# Patient Record
Sex: Female | Born: 1945
Health system: Southern US, Community
[De-identification: ages and names within clinical notes are randomized; demographics above are authoritative.]

## PROBLEM LIST (undated history)

## (undated) DIAGNOSIS — K219 Gastro-esophageal reflux disease without esophagitis: Secondary | ICD-10-CM

## (undated) DIAGNOSIS — M810 Age-related osteoporosis without current pathological fracture: Secondary | ICD-10-CM

## (undated) DIAGNOSIS — T7840XA Allergy, unspecified, initial encounter: Secondary | ICD-10-CM

## (undated) DIAGNOSIS — B009 Herpesviral infection, unspecified: Secondary | ICD-10-CM

## (undated) DIAGNOSIS — F419 Anxiety disorder, unspecified: Secondary | ICD-10-CM

## (undated) DIAGNOSIS — B029 Zoster without complications: Secondary | ICD-10-CM

## (undated) DIAGNOSIS — H9319 Tinnitus, unspecified ear: Secondary | ICD-10-CM

## (undated) DIAGNOSIS — K635 Polyp of colon: Secondary | ICD-10-CM

## (undated) DIAGNOSIS — K649 Unspecified hemorrhoids: Secondary | ICD-10-CM

## (undated) DIAGNOSIS — D649 Anemia, unspecified: Secondary | ICD-10-CM

## (undated) DIAGNOSIS — H269 Unspecified cataract: Secondary | ICD-10-CM

## (undated) HISTORY — DX: Anemia, unspecified: D64.9

## (undated) HISTORY — DX: Unspecified cataract: H26.9

## (undated) HISTORY — DX: Tinnitus, unspecified ear: H93.19

## (undated) HISTORY — DX: Herpesviral infection, unspecified: B00.9

## (undated) HISTORY — DX: Zoster without complications: B02.9

## (undated) HISTORY — PX: COLONOSCOPY: SHX174

## (undated) HISTORY — DX: Anxiety disorder, unspecified: F41.9

## (undated) HISTORY — PX: OTHER SURGICAL HISTORY: SHX169

## (undated) HISTORY — DX: Allergy, unspecified, initial encounter: T78.40XA

## (undated) HISTORY — DX: Age-related osteoporosis without current pathological fracture: M81.0

## (undated) HISTORY — DX: Gastro-esophageal reflux disease without esophagitis: K21.9

## (undated) HISTORY — DX: Unspecified hemorrhoids: K64.9

## (undated) HISTORY — DX: Polyp of colon: K63.5

## (undated) HISTORY — PX: NASAL SEPTUM SURGERY: SHX37

---

## 2017-10-25 LAB — HM COLONOSCOPY

## 2018-08-12 ENCOUNTER — Telehealth: Payer: Self-pay

## 2018-08-12 NOTE — Telephone Encounter (Signed)
Copied from [place] [id number]. Topic: General - Other >> Aug 12, 2018 10:39 AM Yvette Rack wrote: Reason for CRM: Pt called to see if her medical records were received from her previous provider's office. Pt requests call back.

## 2018-08-12 NOTE — Telephone Encounter (Signed)
No

## 2018-08-18 NOTE — Telephone Encounter (Signed)
As of last week I did not have them yet   [place]

## 2018-08-18 NOTE — Telephone Encounter (Addendum)
Patient called again to see if records have been received. Patient would like return call regarding status.

## 2018-09-12 ENCOUNTER — Other Ambulatory Visit: Payer: Self-pay

## 2018-09-17 ENCOUNTER — Ambulatory Visit (INDEPENDENT_AMBULATORY_CARE_PROVIDER_SITE_OTHER): Payer: Medicare Other | Admitting: Internal Medicine

## 2018-09-17 ENCOUNTER — Encounter (INDEPENDENT_AMBULATORY_CARE_PROVIDER_SITE_OTHER): Payer: Self-pay

## 2018-09-17 ENCOUNTER — Encounter: Payer: Self-pay | Admitting: Internal Medicine

## 2018-09-17 ENCOUNTER — Other Ambulatory Visit: Payer: Self-pay

## 2018-09-17 VITALS — BP 130/72 | HR 53 | Temp 98.0°F | Resp 18 | Ht 59.5 in | Wt 110.4 lb

## 2018-09-17 DIAGNOSIS — R5383 Other fatigue: Secondary | ICD-10-CM

## 2018-09-17 DIAGNOSIS — Z1231 Encounter for screening mammogram for malignant neoplasm of breast: Secondary | ICD-10-CM | POA: Diagnosis not present

## 2018-09-17 DIAGNOSIS — M810 Age-related osteoporosis without current pathological fracture: Secondary | ICD-10-CM

## 2018-09-17 DIAGNOSIS — H9313 Tinnitus, bilateral: Secondary | ICD-10-CM | POA: Insufficient documentation

## 2018-09-17 DIAGNOSIS — Z13818 Encounter for screening for other digestive system disorders: Secondary | ICD-10-CM

## 2018-09-17 DIAGNOSIS — G4700 Insomnia, unspecified: Secondary | ICD-10-CM | POA: Diagnosis not present

## 2018-09-17 DIAGNOSIS — Z1329 Encounter for screening for other suspected endocrine disorder: Secondary | ICD-10-CM

## 2018-09-17 DIAGNOSIS — Z1159 Encounter for screening for other viral diseases: Secondary | ICD-10-CM

## 2018-09-17 DIAGNOSIS — Z0000 Encounter for general adult medical examination without abnormal findings: Secondary | ICD-10-CM

## 2018-09-17 DIAGNOSIS — Z1389 Encounter for screening for other disorder: Secondary | ICD-10-CM

## 2018-09-17 DIAGNOSIS — F419 Anxiety disorder, unspecified: Secondary | ICD-10-CM | POA: Insufficient documentation

## 2018-09-17 DIAGNOSIS — H9319 Tinnitus, unspecified ear: Secondary | ICD-10-CM

## 2018-09-17 DIAGNOSIS — Z13220 Encounter for screening for lipoid disorders: Secondary | ICD-10-CM

## 2018-09-17 DIAGNOSIS — E559 Vitamin D deficiency, unspecified: Secondary | ICD-10-CM

## 2018-09-17 HISTORY — DX: Insomnia, unspecified: G47.00

## 2018-09-17 MED ORDER — ALPRAZOLAM 0.25 MG PO TABS: 0.1250 mg | ORAL_TABLET | Freq: Every day | ORAL | 5 refills | Status: DC | PRN

## 2018-09-17 NOTE — Progress Notes (Signed)
Chief Complaint  Patient presents with  . New Patient (Initial Visit)   Establish care 1.anxiety/insomnia/tinninits takes xanax 0.25 1/2 qd #15 and does not overuse  2. tinnitis chronic x years she did have hearing tests in the past unsure if results accurate but no f/u with ENT. Xanax prn helps   No issues otherwise    Review of Systems  Constitutional: Negative for weight loss.       Wt gain 3 lbs   HENT: Positive for tinnitus. Negative for hearing loss.   Eyes: Negative for blurred vision.  Respiratory: Negative for shortness of breath.   Cardiovascular: Negative for chest pain.  Gastrointestinal: Negative for abdominal pain.  Musculoskeletal: Negative for joint pain.  Skin: Negative for rash.  Neurological: Negative for headaches.  Psychiatric/Behavioral: The patient is nervous/anxious and has insomnia.    Past Medical History:  Diagnosis Date  . Anxiety    did not like the way ssri made her feel ? name  . Colon polyps   . Shingles    right back/flank remotely lasting 9 months of pain was on gabapentin  . Tinnitus    Past Surgical History:  Procedure Laterality Date  . dental implants     Family History  Problem Relation Age of Onset  . Cancer Mother        breast later in 4280s and uterine  . Cancer Father        colon  . Cancer Brother        colon   Social History   Socioeconomic History  . Marital status: Married    Spouse name: Not on file  . Number of children: Not on file  . Years of education: Not on file  . Highest education level: Not on file  Occupational History  . Not on file  Social Needs  . Financial resource strain: Not on file  . Food insecurity    Worry: Not on file    Inability: Not on file  . Transportation needs    Medical: Not on file    Non-medical: Not on file  Tobacco Use  . Smoking status: Never Smoker  . Smokeless tobacco: Never Used  Substance and Sexual Activity  . Alcohol use: Yes  . Drug use: Never  . Sexual  activity: Yes  Lifestyle  . Physical activity    Days per week: Not on file    Minutes per session: Not on file  . Stress: Not on file  Relationships  . Social Musicianconnections    Talks on phone: Not on file    Gets together: Not on file    Attends religious service: Not on file    Active member of club or organization: Not on file    Attends meetings of clubs or organizations: Not on file    Relationship status: Not on file  . Intimate partner violence    Fear of current or ex partner: Not on file    Emotionally abused: Not on file    Physically abused: Not on file    Forced sexual activity: Not on file  Other Topics Concern  . Not on file  Social History Narrative   [place] to be near son and grandkids    Never smoker    Exercise yes   Current Meds  Medication Sig  . ALPRAZolam (XANAX) 0.25 MG tablet Take 0.5 tablets (0.125 mg total) by mouth daily as needed for anxiety.  . [DISCONTINUED]  ALPRAZolam (XANAX) 0.25 MG tablet Take 0.25 mg by mouth at bedtime as needed for anxiety.   No Known Allergies No results found for this or any previous visit (from the past 2160 hour(s)). Objective  Body mass index is 21.92 kg/m. Wt Readings from Last 3 Encounters:  09/17/18 110 lb 6.4 oz (50.1 kg)   Temp Readings from Last 3 Encounters:  09/17/18 98 F (36.7 C) (Temporal)   BP Readings from Last 3 Encounters:  09/17/18 130/72   Pulse Readings from Last 3 Encounters:  09/17/18 (!) 53    Physical Exam Vitals signs and nursing note reviewed.  Constitutional:      Appearance: Normal appearance. She is well-developed, well-groomed and normal weight.  HENT:     Head: Normocephalic and atraumatic.     Right Ear: Tympanic membrane and ear canal normal.     Left Ear: Tympanic membrane and ear canal normal.     Nose:     Comments: Mask on   Eyes:     Conjunctiva/sclera: Conjunctivae normal.     Pupils: Pupils are equal, round, and reactive to light.   Cardiovascular:     Rate and Rhythm: Normal rate and regular rhythm.     Heart sounds: Normal heart sounds.  Pulmonary:     Effort: Pulmonary effort is normal.     Breath sounds: Normal breath sounds.  Skin:    General: Skin is warm and dry.  Neurological:     General: No focal deficit present.     Mental Status: She is alert and oriented to person, place, and time. Mental status is at baseline.     Gait: Gait normal.  Psychiatric:        Attention and Perception: Attention and perception normal.        Mood and Affect: Mood and affect normal.        Speech: Speech normal.        Behavior: Behavior normal. Behavior is cooperative.        Thought Content: Thought content normal.        Cognition and Memory: Cognition and memory normal.        Judgment: Judgment normal.     Assessment   1. Anxiety/insomnia 2.tinnitis  3. HM Plan   1. Prn xanax 1/2 pill qd prn  2. Disc rec get ENT consult in future if continues to bother pt declines for now  3.  Will get flu shot upcoming  Never had prevnar or pna 23 or shingrix disc'ed today  rec Tdap been >10 years given info today   Pap out of age window  Mammogram referred today  DEXA referred today  Colonoscopy had in 2019 FH brother and dad colon cancer due in 5 years 2024 h/o polyps personally  Skin: currently no issues h/o cysts removal no skin cancer   Supplements zinc, calcium, vitamin D Time spent 30 minutes   Provider: Dr. Olivia Mackie [name]-Internal Medicine

## 2018-09-17 NOTE — Patient Instructions (Addendum)
Call to schedule mammogram an dbone density   Dentist Dr. Tobe Sos [address] [phone]  Tinnitus Tinnitus refers to hearing a sound when there is no actual source for that sound. This is often described as ringing in the ears. However, people with this condition may hear a variety of noises, in one ear or in both ears. The sounds of tinnitus can be soft, loud, or somewhere in between. Tinnitus can last for a few seconds or can be constant for days. It may go away without treatment and come back at various times. When tinnitus is constant or happens often, it can lead to other problems, such as trouble sleeping and trouble concentrating. Almost everyone experiences tinnitus at some point. Tinnitus that is long-lasting (chronic) or comes back often (recurs) may require medical attention. What are the causes? The cause of tinnitus is often not known. In some cases, it can result from other problems or conditions, including:  Exposure to loud noises from machinery, music, or other sources.  Hearing loss.  Ear or sinus infections.  Earwax buildup.  An object (foreign body) stuck in the ear.  Taking certain medicines.  Drinking alcohol or caffeine.  High blood pressure.  Heart diseases.  Anemia.  Allergies.  Meniere's disease.  Thyroid problems.  Tumors.  A weak, bulging blood vessel (aneurysm) near the ear.  Depression or other mood disorders. What are the signs or symptoms? The main symptom of tinnitus is hearing a sound when there is no source for that sound. It may sound like:  Buzzing.  Roaring.  Ringing.  Blowing air, like the sound heard when you listen to a seashell.  Hissing.  Whistling.  Sizzling.  Humming.  Running water.  A musical note.  Tapping. Symptoms may affect only one ear (unilateral) or both ears (bilateral). How is this diagnosed? Tinnitus is diagnosed based on your symptoms, your medical history, and a physical  exam. Your health care provider may do a thorough hearing test (audiologic exam) if your tinnitus:  Is unilateral.  Causes hearing difficulties.  Lasts 6 months or longer. You may work with a health care provider who specializes in hearing disorders (audiologist). You may be asked questions about your symptoms and how they affect your daily life. You may have other tests done, such as:  CT scan.  MRI.  An imaging test of how blood flows through your blood vessels (angiogram). How is this treated? Treating an underlying medical condition can sometimes make tinnitus go away. If your tinnitus continues, other treatments may include:  Medicines, such as antidepressants or sleeping aids.  Sound generators to mask the tinnitus. These include: ? Tabletop sound machines that play relaxing sounds to help you fall asleep. ? Wearable devices that fit in your ear and play sounds or music. ? Acoustic neural stimulation. This involves using headphones to listen to music that contains an auditory signal. Over time, listening to this signal may change some pathways in your brain and make you less sensitive to tinnitus. This treatment is used for very severe cases when no other treatment is working.  Therapy and counseling to help you manage the stress of living with tinnitus.  Using hearing aids or cochlear implants if your tinnitus is related to hearing loss. Hearing aids are worn in the outer ear. Cochlear implants are surgically placed in the inner ear. Follow these instructions at home: Managing symptoms      When possible, avoid being in loud places and being  exposed to loud sounds.  Wear hearing protection, such as earplugs, when you are exposed to loud noises.  Use a white noise machine, a humidifier, or other devices to mask the sound of tinnitus.  Practice techniques for reducing stress, such as meditation, yoga, or deep breathing. Work with your health care provider if you need help  with managing stress.  Sleep with your head slightly raised. This may reduce the impact of tinnitus. General instructions  Do not use stimulants, such as nicotine, alcohol, or caffeine. Talk with your health care provider about other stimulants to avoid. Stimulants are substances that can make you feel alert and attentive by increasing certain activities in the body (such as heart rate and blood pressure). These substances may make tinnitus worse.  Take over-the-counter and prescription medicines only as told by your health care provider.  Try to get plenty of sleep each night.  Keep all follow-up visits as told by your health care provider. This is important. Contact a health care provider if:  Your tinnitus continues for 3 weeks or longer without stopping.  Your symptoms get worse or do not get better with home care.  You develop tinnitus after a head injury.  You have tinnitus along with any of the following: ? Dizziness. ? Loss of balance. ? Nausea and vomiting. Summary  Tinnitus refers to hearing a sound when there is no actual source for that sound. This is often described as ringing in the ears.  Symptoms may affect only one ear (unilateral) or both ears (bilateral).  Use a white noise machine, a humidifier, or other devices to mask the sound of tinnitus.  Do not use stimulants, such as nicotine, alcohol, or caffeine. Talk with your health care provider about other stimulants to avoid. These substances may make tinnitus worse. This information is not intended to replace advice given to you by your health care provider. Make sure you discuss any questions you have with your health care provider. Document Released: 01/22/2005 Document Revised: 01/04/2017 Document Reviewed: 11/01/2016 Elsevier Patient Education  2020 Elsevier Inc.  Zoster Vaccine, Recombinant injection What is this medicine? ZOSTER VACCINE (ZOS ter vak SEEN) is used to prevent shingles in adults 74 years  old and over. This vaccine is not used to treat shingles or nerve pain from shingles. This medicine may be used for other purposes; ask your health care provider or pharmacist if you have questions. COMMON BRAND NAME(S): [facility] What should I tell my health care provider before I take this medicine? They need to know if you have any of these conditions:  blood disorders or disease  cancer like leukemia or lymphoma  immune system problems or therapy  an unusual or allergic reaction to vaccines, other medications, foods, dyes, or preservatives  pregnant or trying to get pregnant  breast-feeding How should I use this medicine? This vaccine is for injection in a muscle. It is given by a health care professional. Talk to your pediatrician regarding the use of this medicine in children. This medicine is not approved for use in children. Overdosage: If you think you have taken too much of this medicine contact a poison control center or emergency room at once. NOTE: This medicine is only for you. Do not share this medicine with others. What if I miss a dose? Keep appointments for follow-up (booster) doses as directed. It is important not to miss your dose. Call your doctor or health care professional if you are unable to keep an appointment. What  may interact with this medicine?  medicines that suppress your immune system  medicines to treat cancer  steroid medicines like prednisone or cortisone This list may not describe all possible interactions. Give your health care provider a list of all the medicines, herbs, non-prescription drugs, or dietary supplements you use. Also tell them if you smoke, drink alcohol, or use illegal drugs. Some items may interact with your medicine. What should I watch for while using this medicine? Visit your doctor for regular check ups. This vaccine, like all vaccines, may not fully protect everyone. What side effects may I notice from receiving this  medicine? Side effects that you should report to your doctor or health care professional as soon as possible:  allergic reactions like skin rash, itching or hives, swelling of the face, lips, or tongue  breathing problems Side effects that usually do not require medical attention (report these to your doctor or health care professional if they continue or are bothersome):  chills  headache  fever  nausea, vomiting  redness, warmth, pain, swelling or itching at site where injected  tiredness This list may not describe all possible side effects. Call your doctor for medical advice about side effects. You may report side effects to FDA at 1-800-FDA-1088. Where should I keep my medicine? This vaccine is only given in a clinic, pharmacy, doctor's office, or other health care setting and will not be stored at home. NOTE: This sheet is a summary. It may not cover all possible information. If you have questions about this medicine, talk to your doctor, pharmacist, or health care provider.  2020 Elsevier/Gold Standard (2016-09-03 13:20:30)  Pneumococcal Conjugate Vaccine (PCV13): What You Need to Know 1. Why get vaccinated? Pneumococcal conjugate vaccine (PCV13) can prevent pneumococcal disease. Pneumococcal disease refers to any illness caused by pneumococcal bacteria. These bacteria can cause many types of illnesses, including pneumonia, which is an infection of the lungs. Pneumococcal bacteria are one of the most common causes of pneumonia. Besides pneumonia, pneumococcal bacteria can also cause:  Ear infections  Sinus infections  Meningitis (infection of the tissue covering the brain and spinal cord)  Bacteremia (bloodstream infection) Anyone can get pneumococcal disease, but children under 22 years of age, people with certain medical conditions, adults 81 years or older, and cigarette smokers are at the highest risk. Most pneumococcal infections are mild. However, some can result  in long-term problems, such as brain damage or hearing loss. Meningitis, bacteremia, and pneumonia caused by pneumococcal disease can be fatal. 2. PCV13 PCV13 protects against 13 types of bacteria that cause pneumococcal disease. Infants and young children usually need 4 doses of pneumococcal conjugate vaccine, at 2, 4, 6, and 58-81 months of age. In some cases, a child might need fewer than 4 doses to complete PCV13 vaccination. A dose of PCV23 vaccine is also recommended for anyone 2 years or older with certain medical conditions if they did not already receive PCV13. This vaccine may be given to adults 27 years or older based on discussions between the patient and health care provider. 3. Talk with your health care provider Tell your vaccine provider if the person getting the vaccine:  Has had an allergic reaction after a previous dose of PCV13, to an earlier pneumococcal conjugate vaccine known as PCV7, or to any vaccine containing diphtheria toxoid (for example, DTaP), or has any severe, life-threatening allergies.  In some cases, your health care provider may decide to postpone PCV13 vaccination to a future visit. People with minor  illnesses, such as a cold, may be vaccinated. People who are moderately or severely ill should usually wait until they recover before getting PCV13. Your health care provider can give you more information. 4. Risks of a vaccine reaction  Redness, swelling, pain, or tenderness where the shot is given, and fever, loss of appetite, fussiness (irritability), feeling tired, headache, and chills can happen after PCV13. Young children may be at increased risk for seizures caused by fever after PCV13 if it is administered at the same time as inactivated influenza vaccine. Ask your health care provider for more information. People sometimes faint after medical procedures, including vaccination. Tell your provider if you feel dizzy or have vision changes or ringing in the  ears. As with any medicine, there is a very remote chance of a vaccine causing a severe allergic reaction, other serious injury, or death. 5. What if there is a serious problem? An allergic reaction could occur after the vaccinated person leaves the clinic. If you see signs of a severe allergic reaction (hives, swelling of the face and throat, difficulty breathing, a fast heartbeat, dizziness, or weakness), call 9-1-1 and get the person to the nearest hospital. For other signs that concern you, call your health care provider. Adverse reactions should be reported to the Vaccine Adverse Event Reporting System (VAERS). Your health care provider will usually file this report, or you can do it yourself. Visit the VAERS website at www.vaers.LAgents.nohhs.gov or call 24902818571-615-587-1878. VAERS is only for reporting reactions, and VAERS staff do not give medical advice. 6. The National Vaccine Injury Compensation Program The [employer] Vaccine Injury Compensation Program (VICP) is a federal program that was created to compensate people who may have been injured by certain vaccines. Visit the VICP website at SpiritualWord.atwww.hrsa.gov/vaccinecompensation or call 51413984361-(613)145-7168 to learn about the program and about filing a claim. There is a time limit to file a claim for compensation. 7. How can I learn more?  Ask your health care provider.  Call your local or state health department.  Contact the Centers for Disease Control and Prevention (CDC): ? Call 937-122-50341-(641)565-6301 (1-800-CDC-INFO) or ? Visit CDC's website at PicCapture.uywww.cdc.gov/vaccines Vaccine Information Statement PCV13 Vaccine (12/04/2017) This information is not intended to replace advice given to you by your health care provider. Make sure you discuss any questions you have with your health care provider. Document Released: 11/19/2005 Document Revised: 05/13/2018 Document Reviewed: 09/03/2017 Elsevier Patient Education  2020 Elsevier Inc.  Pneumococcal Conjugate Vaccine suspension  for injection What is this medicine? PNEUMOCOCCAL VACCINE (NEU mo KOK al vak SEEN) is a vaccine used to prevent pneumococcus bacterial infections. These bacteria can cause serious infections like pneumonia, meningitis, and blood infections. This vaccine will lower your chance of getting pneumonia. If you do get pneumonia, it can make your symptoms milder and your illness shorter. This vaccine will not treat an infection and will not cause infection. This vaccine is recommended for infants and young children, adults with certain medical conditions, and adults 65 years or older. This medicine may be used for other purposes; ask your health care provider or pharmacist if you have questions. COMMON BRAND NAME(S): Prevnar, Prevnar 13 What should I tell my health care provider before I take this medicine? They need to know if you have any of these conditions:  bleeding problems  fever  immune system problems  an unusual or allergic reaction to pneumococcal vaccine, diphtheria toxoid, other vaccines, latex, other medicines, foods, dyes, or preservatives  pregnant or trying to get pregnant  breast-feeding How should I use this medicine? This vaccine is for injection into a muscle. It is given by a health care professional. A copy of Vaccine Information Statements will be given before each vaccination. Read this sheet carefully each time. The sheet may change frequently. Talk to your pediatrician regarding the use of this medicine in children. While this drug may be prescribed for children as young as 636 weeks old for selected conditions, precautions do apply. Overdosage: If you think you have taken too much of this medicine contact a poison control center or emergency room at once. NOTE: This medicine is only for you. Do not share this medicine with others. What if I miss a dose? It is important not to miss your dose. Call your doctor or health care professional if you are unable to keep an  appointment. What may interact with this medicine?  medicines for cancer chemotherapy  medicines that suppress your immune function  steroid medicines like prednisone or cortisone This list may not describe all possible interactions. Give your health care provider a list of all the medicines, herbs, non-prescription drugs, or dietary supplements you use. Also tell them if you smoke, drink alcohol, or use illegal drugs. Some items may interact with your medicine. What should I watch for while using this medicine? Mild fever and pain should go away in 3 days or less. Report any unusual symptoms to your doctor or health care professional. What side effects may I notice from receiving this medicine? Side effects that you should report to your doctor or health care professional as soon as possible:  allergic reactions like skin rash, itching or hives, swelling of the face, lips, or tongue  breathing problems  confused  fast or irregular heartbeat  fever over 102 degrees F  seizures  unusual bleeding or bruising  unusual muscle weakness Side effects that usually do not require medical attention (report to your doctor or health care professional if they continue or are bothersome):  aches and pains  diarrhea  fever of 102 degrees F or less  headache  irritable  loss of appetite  pain, tender at site where injected  trouble sleeping This list may not describe all possible side effects. Call your doctor for medical advice about side effects. You may report side effects to FDA at 1-800-FDA-1088. Where should I keep my medicine? This does not apply. This vaccine is given in a clinic, pharmacy, doctor's office, or other health care setting and will not be stored at home. NOTE: This sheet is a summary. It may not cover all possible information. If you have questions about this medicine, talk to your doctor, pharmacist, or health care provider.  2020 Elsevier/Gold Standard  (2013-10-29 10:27:27)

## 2018-09-22 ENCOUNTER — Other Ambulatory Visit: Payer: Medicare Other

## 2018-09-23 ENCOUNTER — Other Ambulatory Visit (INDEPENDENT_AMBULATORY_CARE_PROVIDER_SITE_OTHER): Payer: Medicare Other

## 2018-09-23 ENCOUNTER — Other Ambulatory Visit: Payer: Self-pay

## 2018-09-23 DIAGNOSIS — Z0000 Encounter for general adult medical examination without abnormal findings: Secondary | ICD-10-CM | POA: Diagnosis not present

## 2018-09-23 DIAGNOSIS — Z13220 Encounter for screening for lipoid disorders: Secondary | ICD-10-CM

## 2018-09-23 DIAGNOSIS — Z1329 Encounter for screening for other suspected endocrine disorder: Secondary | ICD-10-CM | POA: Diagnosis not present

## 2018-09-23 DIAGNOSIS — F419 Anxiety disorder, unspecified: Secondary | ICD-10-CM

## 2018-09-23 DIAGNOSIS — Z1159 Encounter for screening for other viral diseases: Secondary | ICD-10-CM

## 2018-09-23 DIAGNOSIS — Z13818 Encounter for screening for other digestive system disorders: Secondary | ICD-10-CM

## 2018-09-23 DIAGNOSIS — Z1389 Encounter for screening for other disorder: Secondary | ICD-10-CM

## 2018-09-23 DIAGNOSIS — E559 Vitamin D deficiency, unspecified: Secondary | ICD-10-CM

## 2018-09-23 DIAGNOSIS — R5383 Other fatigue: Secondary | ICD-10-CM | POA: Diagnosis not present

## 2018-09-23 LAB — COMPREHENSIVE METABOLIC PANEL
ALT: 19 U/L (ref 0–35)
AST: 16 U/L (ref 0–37)
Albumin: 4.4 g/dL (ref 3.5–5.2)
Alkaline Phosphatase: 78 U/L (ref 39–117)
BUN: 9 mg/dL (ref 6–23)
CO2: 26 mEq/L (ref 19–32)
Calcium: 9.6 mg/dL (ref 8.4–10.5)
Chloride: 106 mEq/L (ref 96–112)
Creatinine, Ser: 0.74 mg/dL (ref 0.40–1.20)
GFR: 76.62 mL/min (ref >60.00)
Glucose, Bld: 94 mg/dL (ref 70–99)
Potassium: 4.2 mEq/L (ref 3.5–5.1)
Sodium: 141 mEq/L (ref 135–145)
Total Bilirubin: 0.4 mg/dL (ref 0.2–1.2)
Total Protein: 6.7 g/dL (ref 6.0–8.3)

## 2018-09-23 LAB — CBC WITH DIFFERENTIAL/PLATELET
Basophils Absolute: 0 10*3/uL (ref 0.0–0.1)
Basophils Relative: 0.5 % (ref 0.0–3.0)
Eosinophils Absolute: 0.1 10*3/uL (ref 0.0–0.7)
Eosinophils Relative: 2.3 % (ref 0.0–5.0)
HCT: 41.1 % (ref 36.0–46.0)
Hemoglobin: 13.7 g/dL (ref 12.0–15.0)
Lymphocytes Relative: 34.8 % (ref 12.0–46.0)
Lymphs Abs: 2.1 10*3/uL (ref 0.7–4.0)
MCHC: 33.4 g/dL (ref 30.0–36.0)
MCV: 95.5 fl (ref 78.0–100.0)
Monocytes Absolute: 0.3 10*3/uL (ref 0.1–1.0)
Monocytes Relative: 5.3 % (ref 3.0–12.0)
Neutro Abs: 3.5 10*3/uL (ref 1.4–7.7)
Neutrophils Relative %: 57.1 % (ref 43.0–77.0)
Platelets: 190 10*3/uL (ref 150.0–400.0)
RBC: 4.31 Mil/uL (ref 3.87–5.11)
RDW: 12.9 % (ref 11.5–15.5)
WBC: 6.1 10*3/uL (ref 4.0–10.5)

## 2018-09-23 LAB — LIPID PANEL
Cholesterol: 203 mg/dL — ABNORMAL HIGH (ref 0–200)
HDL: 69.6 mg/dL (ref >39.00)
LDL Cholesterol: 115 mg/dL — ABNORMAL HIGH (ref 0–99)
NonHDL: 132.91
Total CHOL/HDL Ratio: 3
Triglycerides: 88 mg/dL (ref 0.0–149.0)
VLDL: 17.6 mg/dL (ref 0.0–40.0)

## 2018-09-23 LAB — VITAMIN D 25 HYDROXY (VIT D DEFICIENCY, FRACTURES): VITD: 49.02 ng/mL (ref 30.00–100.00)

## 2018-09-23 LAB — TSH: TSH: 1.92 u[IU]/mL (ref 0.35–4.50)

## 2018-09-24 LAB — URINALYSIS, ROUTINE W REFLEX MICROSCOPIC
Bilirubin Urine: NEGATIVE
Glucose, UA: NEGATIVE
Hgb urine dipstick: NEGATIVE
Ketones, ur: NEGATIVE
Leukocytes,Ua: NEGATIVE
Nitrite: NEGATIVE
Protein, ur: NEGATIVE
Specific Gravity, Urine: 1.002 (ref 1.001–1.03)
pH: 6.5 (ref 5.0–8.0)

## 2018-09-24 LAB — HEPATITIS C ANTIBODY
Hepatitis C Ab: NONREACTIVE
SIGNAL TO CUT-OFF: 0.01 (ref <1.00)

## 2018-09-30 ENCOUNTER — Telehealth: Payer: Self-pay | Admitting: Internal Medicine

## 2018-09-30 ENCOUNTER — Encounter: Payer: Self-pay | Admitting: Internal Medicine

## 2018-09-30 NOTE — Telephone Encounter (Signed)
FYI-Pt wanted to let Dr Aundra Dubin know she's had colonoscopy 2019 in the fall. Not due for another 5 years.Thank you!

## 2018-10-01 ENCOUNTER — Encounter: Payer: Self-pay | Admitting: Internal Medicine

## 2018-10-01 NOTE — Telephone Encounter (Signed)
See mychart.  

## 2018-10-01 NOTE — Telephone Encounter (Signed)
Patient had colonoscopy in [place]. Was supposed to get me the name of the doctor but phone was disconnected.

## 2018-10-02 ENCOUNTER — Ambulatory Visit: Payer: Medicare Other | Admitting: Internal Medicine

## 2018-10-03 ENCOUNTER — Ambulatory Visit (INDEPENDENT_AMBULATORY_CARE_PROVIDER_SITE_OTHER): Payer: Medicare Other

## 2018-10-03 ENCOUNTER — Other Ambulatory Visit: Payer: Self-pay

## 2018-10-03 DIAGNOSIS — Z23 Encounter for immunization: Secondary | ICD-10-CM | POA: Diagnosis not present

## 2018-10-16 ENCOUNTER — Encounter: Payer: Self-pay | Admitting: Internal Medicine

## 2018-11-26 ENCOUNTER — Ambulatory Visit
Admission: RE | Admit: 2018-11-26 | Discharge: 2018-11-26 | Disposition: A | Discharge status: Home / Self Care | Payer: Medicare Other | Source: Ambulatory Visit | Attending: Internal Medicine | Admitting: Internal Medicine

## 2018-11-26 ENCOUNTER — Encounter: Payer: Self-pay | Admitting: Radiology

## 2018-11-26 DIAGNOSIS — M810 Age-related osteoporosis without current pathological fracture: Secondary | ICD-10-CM

## 2018-11-26 DIAGNOSIS — Z1231 Encounter for screening mammogram for malignant neoplasm of breast: Secondary | ICD-10-CM | POA: Diagnosis present

## 2018-11-27 ENCOUNTER — Encounter: Payer: Self-pay | Admitting: Internal Medicine

## 2018-12-10 ENCOUNTER — Other Ambulatory Visit: Payer: Self-pay | Admitting: Internal Medicine

## 2018-12-10 ENCOUNTER — Encounter: Payer: Self-pay | Admitting: Internal Medicine

## 2018-12-10 DIAGNOSIS — R928 Other abnormal and inconclusive findings on diagnostic imaging of breast: Secondary | ICD-10-CM

## 2018-12-10 DIAGNOSIS — N6489 Other specified disorders of breast: Secondary | ICD-10-CM

## 2019-02-04 ENCOUNTER — Encounter: Payer: Self-pay | Admitting: Internal Medicine

## 2019-02-11 ENCOUNTER — Encounter: Payer: Self-pay | Admitting: Internal Medicine

## 2019-03-25 ENCOUNTER — Ambulatory Visit: Payer: Medicare Other | Admitting: Internal Medicine

## 2019-04-06 ENCOUNTER — Encounter: Payer: Self-pay | Admitting: Family Medicine

## 2019-04-06 ENCOUNTER — Ambulatory Visit (INDEPENDENT_AMBULATORY_CARE_PROVIDER_SITE_OTHER): Payer: Medicare Other | Admitting: Family Medicine

## 2019-04-06 ENCOUNTER — Telehealth: Payer: Self-pay | Admitting: Internal Medicine

## 2019-04-06 VITALS — BP 147/76 | HR 66 | Temp 98.2°F | Wt 109.0 lb

## 2019-04-06 DIAGNOSIS — R197 Diarrhea, unspecified: Secondary | ICD-10-CM | POA: Diagnosis not present

## 2019-04-06 NOTE — Telephone Encounter (Signed)
For your information  

## 2019-04-06 NOTE — Progress Notes (Signed)
I connected with Beverly Lowe on 04/06/19 at  4:20 PM EST by video and verified that I am speaking with the correct person using two identifiers.   I discussed the limitations, risks, security and privacy concerns of performing an evaluation and management service by video and the availability of in person appointments. I also discussed with the patient that there may be a patient responsible charge related to this service. The patient expressed understanding and agreed to proceed.  Patient location: Home Provider Location: [facility] Participants: Lynnda Child and Beverly Lowe   Subjective:     Beverly Lowe is a 74 y.o. female presenting for Diarrhea (and stomach cramps. Started on 03/30/19 after she came from [place]. started there also after eating a certain meal)     Diarrhea  This is a new problem. The current episode started in the past 7 days. The problem occurs less than 2 times per day. The stool consistency is described as watery. The patient states that diarrhea does not awaken her from sleep. Associated symptoms include abdominal pain (cramps across the stomach). Pertinent negatives include no bloating, chills, coughing, fever, headaches, increased  flatus, myalgias, sweats, vomiting or weight loss. Associated symptoms comments: Nausea, low appetite. Exacerbated by: any food product. Treatments tried: modifying diet.   Did get bad diarrhea after eating a lot of food - did have ahi tuna, no one else got sick after this meal. Does not typically eat and drink as much as she did.     Review of Systems  Constitutional: Negative for chills, fever and weight loss.  Respiratory: Negative for cough.   Gastrointestinal: Positive for abdominal pain (cramps across the stomach) and diarrhea. Negative for bloating, flatus and vomiting.  Musculoskeletal: Negative for myalgias.  Neurological: Negative for headaches.     Social History   Tobacco Use  Smoking  Status Never Smoker  Smokeless Tobacco Never Used        Objective:   BP Readings from Last 3 Encounters:  04/06/19 (!) 147/76  09/17/18 130/72   Wt Readings from Last 3 Encounters:  04/06/19 109 lb (49.4 kg)  09/17/18 110 lb 6.4 oz (50.1 kg)   BP (!) 147/76 Comment: per patient  Pulse 66 Comment: per patient  Temp 98.2 F (36.8 C) Comment: per patient  Wt 109 lb (49.4 kg) Comment: per patient  BMI 21.65 kg/m    Physical Exam Constitutional:      Appearance: Normal appearance. She is not ill-appearing.  HENT:     Head: Normocephalic and atraumatic.     Right Ear: External ear normal.     Left Ear: External ear normal.  Eyes:     Conjunctiva/sclera: Conjunctivae normal.  Pulmonary:     Effort: Pulmonary effort is normal. No respiratory distress.  Neurological:     Mental Status: She is alert. Mental status is at baseline.  Psychiatric:        Mood and Affect: Mood normal.        Behavior: Behavior normal.        Thought Content: Thought content normal.        Judgment: Judgment normal.             Assessment & Plan:   Problem List Items Addressed This Visit    None    Visit Diagnoses    Diarrhea of presumed infectious origin    -  Primary   Relevant Orders   Gastrointestinal Pathogen Panel PCR  Etiology unclear. New onset after travel to [place] and eating raw fish. Though persisting with 1 BM daily after eating. Will get infectious panel. Recommend f/u if negative and symptoms persist  Return if symptoms worsen or fail to improve.  Lesleigh Noe, MD

## 2019-04-06 NOTE — Telephone Encounter (Signed)
Pt called back and said she has had watery stools since she got back from [place] last Monday. She thinks she has a parasite or virus. She can't eat without having to go to bathroom. Since we did not have any availability today I got her scheduled with Dr. Selena Batten @ [facility] today @ 4:30. She still wanted her pcp to know what was going on.

## 2019-04-06 NOTE — Telephone Encounter (Signed)
Lm to call office to make an appointment per patient's message through MyChart. If patient needs appointment needs to be virtual.

## 2019-04-07 ENCOUNTER — Other Ambulatory Visit: Payer: PRIVATE HEALTH INSURANCE

## 2019-04-07 ENCOUNTER — Telehealth: Payer: Self-pay

## 2019-04-07 NOTE — Addendum Note (Signed)
Addended by: Alvina Chou on: 04/07/2019 02:30 PM   Modules accepted: Orders

## 2019-04-07 NOTE — Telephone Encounter (Signed)
Noted  

## 2019-04-07 NOTE — Telephone Encounter (Signed)
This is a patient of [employer] [facility] and was seen for a virtual visit by Dr. Einar Pheasant yesterday. Patient recently went on a trip to [place] and has been suffering from diarrhea since returning home.  Dr. Einar Pheasant ordered a PCR stool kit for patient after her virtual visit, and patient stated that she is closer to [facility] location, where she is normally seen at, and she was wondering if she could pick up the stool kit there. Patient called the [facility] location and spoke to the lab and they told her that she could not pick up the stool kit there because of COVID precautions, and that they could not even take it out to her car.  Patient then stated to the lab that she will come to the [facility] office to pick up the kit, but asked if they could just explain to her how to do the stool kit because she does not understand, and they said she would have to ask Dr. Verda Cumins office office because it was "not their responsibility." Patient's states she did not understand this because she has had labs done between [place] offices before and she thought this would be ok for them to explain a standard procedure.  Patient states she did not feel good at all, and she just wanted to pick up a stool kit so she could find out what is wrong with her. Patient has been contacted today and she is coming here at 11:00 to pick up PCR stool kit and I have spoken with Irene Shipper in the lab who will walk her through the process of collecting proper sample.  Will route to [employer], Therapist, sports. Thank you.

## 2019-04-08 LAB — GASTROINTESTINAL PATHOGEN PANEL PCR
C. difficile Tox A/B, PCR: NOT DETECTED
Campylobacter, PCR: NOT DETECTED
Cryptosporidium, PCR: NOT DETECTED
E coli (ETEC) LT/ST PCR: NOT DETECTED
E coli (STEC) stx1/stx2, PCR: NOT DETECTED
E coli 0157, PCR: NOT DETECTED
Giardia lamblia, PCR: NOT DETECTED
Norovirus, PCR: NOT DETECTED
Rotavirus A, PCR: NOT DETECTED
Salmonella, PCR: NOT DETECTED
Shigella, PCR: NOT DETECTED

## 2019-04-09 ENCOUNTER — Ambulatory Visit: Payer: Medicare Other | Admitting: Internal Medicine

## 2019-04-15 ENCOUNTER — Ambulatory Visit: Payer: PRIVATE HEALTH INSURANCE | Admitting: Family Medicine

## 2019-04-30 ENCOUNTER — Other Ambulatory Visit: Payer: Self-pay

## 2019-05-05 ENCOUNTER — Ambulatory Visit (INDEPENDENT_AMBULATORY_CARE_PROVIDER_SITE_OTHER): Payer: Medicare Other | Admitting: Internal Medicine

## 2019-05-05 ENCOUNTER — Encounter: Payer: Self-pay | Admitting: Internal Medicine

## 2019-05-05 ENCOUNTER — Telehealth: Payer: Self-pay | Admitting: Internal Medicine

## 2019-05-05 ENCOUNTER — Other Ambulatory Visit: Payer: Self-pay

## 2019-05-05 VITALS — BP 130/82 | HR 67 | Temp 97.2°F | Ht 59.5 in | Wt 112.8 lb

## 2019-05-05 DIAGNOSIS — B009 Herpesviral infection, unspecified: Secondary | ICD-10-CM | POA: Diagnosis not present

## 2019-05-05 DIAGNOSIS — K649 Unspecified hemorrhoids: Secondary | ICD-10-CM | POA: Diagnosis not present

## 2019-05-05 DIAGNOSIS — Z1389 Encounter for screening for other disorder: Secondary | ICD-10-CM

## 2019-05-05 DIAGNOSIS — Z1329 Encounter for screening for other suspected endocrine disorder: Secondary | ICD-10-CM

## 2019-05-05 DIAGNOSIS — H9313 Tinnitus, bilateral: Secondary | ICD-10-CM

## 2019-05-05 DIAGNOSIS — M810 Age-related osteoporosis without current pathological fracture: Secondary | ICD-10-CM | POA: Insufficient documentation

## 2019-05-05 DIAGNOSIS — R1011 Right upper quadrant pain: Secondary | ICD-10-CM | POA: Diagnosis not present

## 2019-05-05 DIAGNOSIS — E785 Hyperlipidemia, unspecified: Secondary | ICD-10-CM

## 2019-05-05 MED ORDER — VALACYCLOVIR HCL 1 G PO TABS: 2000.0000 mg | ORAL_TABLET | Freq: Two times a day (BID) | ORAL | 11 refills | Status: DC | PRN

## 2019-05-05 MED ORDER — HYDROCORTISONE (PERIANAL) 2.5 % EX CREA: 1.0000 "application " | TOPICAL_CREAM | Freq: Two times a day (BID) | CUTANEOUS | 11 refills | Status: DC

## 2019-05-05 NOTE — Patient Instructions (Addendum)
Dr. McQueen/Vaught Consider prevnar, pna 23 vaccine, Tdap, and shingrix vaccines x 2 doses  Cholelithiasis  Cholelithiasis is a form of gallbladder disease in which gallstones form in the gallbladder. The gallbladder is an organ that stores bile. Bile is made in the liver, and it helps to digest fats. Gallstones begin as small crystals and slowly grow into stones. They may cause no symptoms until the gallbladder tightens (contracts) and a gallstone is blocking the duct (gallbladder attack), which can cause pain. Cholelithiasis is also referred to as gallstones. There are two main types of gallstones:  Cholesterol stones. These are made of hardened cholesterol and are usually yellow-green in color. They are the most common type of gallstone. Cholesterol is a white, waxy, fat-like substance that is made in the liver.  Pigment stones. These are dark in color and are made of a red-yellow substance that forms when hemoglobin from red blood cells breaks down (bilirubin). What are the causes? This condition may be caused by an imbalance in the substances that bile is made of. This can happen if the bile:  Has too much bilirubin.  Has too much cholesterol.  Does not have enough bile salts. These salts help the body absorb and digest fats. In some cases, this condition can also be caused by the gallbladder not emptying completely or often enough. What increases the risk? The following factors may make you more likely to develop this condition:  Being female.  Having multiple pregnancies. Health care providers sometimes advise removing diseased gallbladders before future pregnancies.  Eating a diet that is heavy in fried foods, fat, and refined carbohydrates, like white bread and white rice.  Being obese.  Being older than age 58.  Prolonged use of medicines that contain female hormones (estrogen).  Having diabetes mellitus.  Rapidly losing weight.  Having a family history of  gallstones.  Being of [place] or [place] descent.  Having an intestinal disease such as Crohn disease.  Having metabolic syndrome.  Having cirrhosis.  Having severe types of anemia such as sickle cell anemia. What are the signs or symptoms? In most cases, there are no symptoms. These are known as silent gallstones. If a gallstone blocks the bile ducts, it can cause a gallbladder attack. The main symptom of a gallbladder attack is sudden pain in the upper right abdomen. The pain usually comes at night or after eating a large meal. The pain can last for one or several hours and can spread to the right shoulder or chest. If the bile duct is blocked for more than a few hours, it can cause infection or inflammation of the gallbladder, liver, or pancreas, which may cause:  Nausea.  Vomiting.  Abdominal pain that lasts for 5 hours or more.  Fever or chills.  Yellowing of the skin or the whites of the eyes (jaundice).  Dark urine.  Light-colored stools. How is this diagnosed? This condition may be diagnosed based on:  A physical exam.  Your medical history.  An ultrasound of your gallbladder.  CT scan.  MRI.  Blood tests to check for signs of infection or inflammation.  A scan of your gallbladder and bile ducts (biliary system) using nonharmful radioactive material and special cameras that can see the radioactive material (cholescintigram). This test checks to see how your gallbladder contracts and whether bile ducts are blocked.  Inserting a small tube with a camera on the end (endoscope) through your mouth to inspect bile ducts and check for blockages (endoscopic retrograde  cholangiopancreatogram). How is this treated? Treatment for gallstones depends on the severity of the condition. Silent gallstones do not need treatment. If the gallstones cause a gallbladder attack or other symptoms, treatment may be required. Options for treatment include:  Surgery to remove  the gallbladder (cholecystectomy). This is the most common treatment.  Medicines to dissolve gallstones. These are most effective at treating small gallstones. You may need to take medicines for up to 6-12 months.  Shock wave treatment (extracorporeal biliary lithotripsy). In this treatment, an ultrasound machine sends shock waves to the gallbladder to break gallstones into smaller pieces. These pieces can then be passed into the intestines or be dissolved by medicine. This is rarely used.  Removing gallstones through endoscopic retrograde cholangiopancreatogram. A small basket can be attached to the endoscope and used to capture and remove gallstones. Follow these instructions at home:  Take over-the-counter and prescription medicines only as told by your health care provider.  Maintain a healthy weight and follow a healthy diet. This includes: ? Reducing fatty foods, such as fried food. ? Reducing refined carbohydrates, like white bread and white rice. ? Increasing fiber. Aim for foods like almonds, fruit, and beans.  Keep all follow-up visits as told by your health care provider. This is important. Contact a health care provider if:  You think you have had a gallbladder attack.  You have been diagnosed with silent gallstones and you develop abdominal pain or indigestion. Get help right away if:  You have pain from a gallbladder attack that lasts for more than 2 hours.  You have abdominal pain that lasts for more than 5 hours.  You have a fever or chills.  You have persistent nausea and vomiting.  You develop jaundice.  You have dark urine or light-colored stools. Summary  Cholelithiasis (also called gallstones) is a form of gallbladder disease in which gallstones form in the gallbladder.  This condition is caused by an imbalance in the substances that make up bile. This can happen if the bile has too much cholesterol, too much bilirubin, or not enough bile salts.  You are  more likely to develop this condition if you are female, pregnant, using medicines with estrogen, obese, older than age 47, or have a family history of gallstones. You may also develop gallstones if you have diabetes, an intestinal disease, cirrhosis, or metabolic syndrome.  Treatment for gallstones depends on the severity of the condition. Silent gallstones do not need treatment.  If gallstones cause a gallbladder attack or other symptoms, treatment may be needed. The most common treatment is surgery to remove the gallbladder. This information is not intended to replace advice given to you by your health care provider. Make sure you discuss any questions you have with your health care provider. Document Revised: 01/04/2017 Document Reviewed: 10/09/2015 Elsevier Patient Education  [place].  Tinnitus Tinnitus refers to hearing a sound when there is no actual source for that sound. This is often described as ringing in the ears. However, people with this condition may hear a variety of noises, in one ear or in both ears. The sounds of tinnitus can be soft, loud, or somewhere in between. Tinnitus can last for a few seconds or can be constant for days. It may go away without treatment and come back at various times. When tinnitus is constant or happens often, it can lead to other problems, such as trouble sleeping and trouble concentrating. Almost everyone experiences tinnitus at some point. Tinnitus that is long-lasting (  chronic) or comes back often (recurs) may require medical attention. What are the causes? The cause of tinnitus is often not known. In some cases, it can result from:  Exposure to loud noises from machinery, music, or other sources.  An object (foreign body) stuck in the ear.  Earwax buildup.  Drinking alcohol or caffeine.  Taking certain medicines.  Age-related hearing loss. It may also be caused by medical conditions such as:  Ear or sinus infections.  High  blood pressure.  Heart diseases.  Anemia.  Allergies.  Meniere's disease.  Thyroid problems.  Tumors.  A weak, bulging blood vessel (aneurysm) near the ear. What are the signs or symptoms? The main symptom of tinnitus is hearing a sound when there is no source for that sound. It may sound like:  Buzzing.  Roaring.  Ringing.  Blowing air.  Hissing.  Whistling.  Sizzling.  Humming.  Running water.  A musical note.  Tapping. Symptoms may affect only one ear (unilateral) or both ears (bilateral). How is this diagnosed? Tinnitus is diagnosed based on your symptoms, your medical history, and a physical exam. Your health care provider may do a thorough hearing test (audiologic exam) if your tinnitus:  Is unilateral.  Causes hearing difficulties.  Lasts 6 months or longer. You may work with a health care provider who specializes in hearing disorders (audiologist). You may be asked questions about your symptoms and how they affect your daily life. You may have other tests done, such as:  CT scan.  MRI.  An imaging test of how blood flows through your blood vessels (angiogram). How is this treated? Treating an underlying medical condition can sometimes make tinnitus go away. If your tinnitus continues, other treatments may include:  Medicines.  Therapy and counseling to help you manage the stress of living with tinnitus.  Sound generators to mask the tinnitus. These include: ? Tabletop sound machines that play relaxing sounds to help you fall asleep. ? Wearable devices that fit in your ear and play sounds or music. ? Acoustic neural stimulation. This involves using headphones to listen to music that contains an auditory signal. Over time, listening to this signal may change some pathways in your brain and make you less sensitive to tinnitus. This treatment is used for very severe cases when no other treatment is working.  Using hearing aids or cochlear  implants if your tinnitus is related to hearing loss. Hearing aids are worn in the outer ear. Cochlear implants are surgically placed in the inner ear. Follow these instructions at home: Managing symptoms      When possible, avoid being in loud places and being exposed to loud sounds.  Wear hearing protection, such as earplugs, when you are exposed to loud noises.  Use a white noise machine, a humidifier, or other devices to mask the sound of tinnitus.  Practice techniques for reducing stress, such as meditation, yoga, or deep breathing. Work with your health care provider if you need help with managing stress.  Sleep with your head slightly raised. This may reduce the impact of tinnitus. General instructions  Do not use stimulants, such as nicotine, alcohol, or caffeine. Talk with your health care provider about other stimulants to avoid. Stimulants are substances that can make you feel alert and attentive by increasing certain activities in the body (such as heart rate and blood pressure). These substances may make tinnitus worse.  Take over-the-counter and prescription medicines only as told by your health care provider.  Try  to get plenty of sleep each night.  Keep all follow-up visits as told by your health care provider. This is important. Contact a health care provider if:  Your tinnitus continues for 3 weeks or longer without stopping.  You develop sudden hearing loss.  Your symptoms get worse or do not get better with home care.  You feel you are not able to manage the stress of living with tinnitus. Get help right away if:  You develop tinnitus after a head injury.  You have tinnitus along with any of the following: ? Dizziness. ? Loss of balance. ? Nausea and vomiting. ? Sudden, severe headache. These symptoms may represent a serious problem that is an emergency. Do not wait to see if the symptoms will go away. Get medical help right away. Call your local  emergency services (911 in the [place] Do not drive yourself to the hospital. Summary  Tinnitus refers to hearing a sound when there is no actual source for that sound. This is often described as ringing in the ears.  Symptoms may affect only one ear (unilateral) or both ears (bilateral).  Use a white noise machine, a humidifier, or other devices to mask the sound of tinnitus.  Do not use stimulants, such as nicotine, alcohol, or caffeine. Talk with your health care provider about other stimulants to avoid. These substances may make tinnitus worse. This information is not intended to replace advice given to you by your health care provider. Make sure you discuss any questions you have with your health care provider. Document Revised: 08/06/2018 Document Reviewed: 11/01/2016 Elsevier Patient Education  [place].    Osteoporosis  Osteoporosis is thinning and loss of density in your bones. Osteoporosis makes bones more brittle and fragile and more likely to break (fracture). Over time, osteoporosis can cause your bones to become so weak that they fracture after a minor fall. Bones in the hip, wrist, and spine are most likely to fracture due to osteoporosis. What are the causes? The exact cause of this condition is not known. What increases the risk? You may be at greater risk for osteoporosis if you:  Have a family history of the condition.  Have poor nutrition.  Use steroid medicines, such as prednisone.  Are female.  Are age 17 or older.  Smoke or have a history of smoking.  Are not physically active (are sedentary).  Are white (Caucasian) or of Asian descent.  Have a small body frame.  Take certain medicines, such as antiseizure medicines. What are the signs or symptoms? A fracture might be the first sign of osteoporosis, especially if the fracture results from a fall or injury that usually would not cause a bone to break. Other signs and symptoms include:  Pain  in the neck or low back.  Stooped posture.  Loss of height. How is this diagnosed? This condition may be diagnosed based on:  Your medical history.  A physical exam.  A bone mineral density test, also called a DXA or DEXA test (dual-energy X-ray absorptiometry test). This test uses X-rays to measure the amount of minerals in your bones. How is this treated? The goal of treatment is to strengthen your bones and lower your risk for a fracture. Treatment may involve:  Making lifestyle changes, such as: ? Including foods with more calcium and vitamin D in your diet. ? Doing weight-bearing and muscle-strengthening exercises. ? Stopping tobacco use. ? Limiting alcohol intake.  Taking medicine to slow the process of bone  loss or to increase bone density.  Taking daily supplements of calcium and vitamin D.  Taking hormone replacement medicines, such as estrogen for women and testosterone for men.  Monitoring your levels of calcium and vitamin D. Follow these instructions at home:  Activity  Exercise as told by your health care provider. Ask your health care provider what exercises and activities are safe for you. You should do: ? Exercises that make you work against gravity (weight-bearing exercises), such as tai chi, yoga, or walking. ? Exercises to strengthen muscles, such as lifting weights. Lifestyle  Limit alcohol intake to no more than 1 drink a day for nonpregnant women and 2 drinks a day for men. One drink equals 12 oz of beer, 5 oz of wine, or 1 oz of hard liquor.  Do not use any products that contain nicotine or tobacco, such as cigarettes and e-cigarettes. If you need help quitting, ask your health care provider. Preventing falls  Use devices to help you move around (mobility aids) as needed, such as canes, walkers, scooters, or crutches.  Keep rooms well-lit and clutter-free.  Remove tripping hazards from walkways, including cords and throw rugs.  Install grab  bars in bathrooms and safety rails on stairs.  Use rubber mats in the bathroom and other areas that are often wet or slippery.  Wear closed-toe shoes that fit well and support your feet. Wear shoes that have rubber soles or low heels.  Review your medicines with your health care provider. Some medicines can cause dizziness or changes in blood pressure, which can increase your risk of falling. General instructions  Include calcium and vitamin D in your diet. Calcium is important for bone health, and vitamin D helps your body to absorb calcium. Good sources of calcium and vitamin D include: ? Certain fatty fish, such as salmon and tuna. ? Products that have calcium and vitamin D added to them (fortified products), such as fortified cereals. ? Egg yolks. ? Cheese. ? Liver.  Take over-the-counter and prescription medicines only as told by your health care provider.  Keep all follow-up visits as told by your health care provider. This is important. Contact a health care provider if:  You have never been screened for osteoporosis and you are: ? A woman who is age 78 or older. ? A man who is age 41 or older. Get help right away if:  You fall or injure yourself. Summary  Osteoporosis is thinning and loss of density in your bones. This makes bones more brittle and fragile and more likely to break (fracture),even with minor falls.  The goal of treatment is to strengthen your bones and reduce your risk for a fracture.  Include calcium and vitamin D in your diet. Calcium is important for bone health, and vitamin D helps your body to absorb calcium.  Talk with your health care provider about screening for osteoporosis if you are a woman who is age 2 or older, or a man who is age 39 or older. This information is not intended to replace advice given to you by your health care provider. Make sure you discuss any questions you have with your health care provider. Document Revised: 01/04/2017  Document Reviewed: 11/16/2016 Elsevier Patient Education  [place].  Denosumab injection/prolia What is this medicine? DENOSUMAB (den oh sue mab) slows bone breakdown. Prolia is used to treat osteoporosis in women after menopause and in men, and in people who are taking corticosteroids for 6 months or more. Delton See  is used to treat a high calcium level due to cancer and to prevent bone fractures and other bone problems caused by multiple myeloma or cancer bone metastases. Delton See is also used to treat giant cell tumor of the bone. This medicine may be used for other purposes; ask your health care provider or pharmacist if you have questions. COMMON BRAND NAME(S): Prolia, XGEVA What should I tell my health care provider before I take this medicine? They need to know if you have any of these conditions:  dental disease  having surgery or tooth extraction  infection  kidney disease  low levels of calcium or Vitamin D in the blood  malnutrition  on hemodialysis  skin conditions or sensitivity  thyroid or parathyroid disease  an unusual reaction to denosumab, other medicines, foods, dyes, or preservatives  pregnant or trying to get pregnant  breast-feeding How should I use this medicine? This medicine is for injection under the skin. It is given by a health care professional in a hospital or clinic setting. A special MedGuide will be given to you before each treatment. Be sure to read this information carefully each time. For Prolia, talk to your pediatrician regarding the use of this medicine in children. Special care may be needed. For Delton See, talk to your pediatrician regarding the use of this medicine in children. While this drug may be prescribed for children as young as 13 years for selected conditions, precautions do apply. Overdosage: If you think you have taken too much of this medicine contact a poison control center or emergency room at once. NOTE: This medicine is  only for you. Do not share this medicine with others. What if I miss a dose? It is important not to miss your dose. Call your doctor or health care professional if you are unable to keep an appointment. What may interact with this medicine? Do not take this medicine with any of the following medications:  other medicines containing denosumab This medicine may also interact with the following medications:  medicines that lower your chance of fighting infection  steroid medicines like prednisone or cortisone This list may not describe all possible interactions. Give your health care provider a list of all the medicines, herbs, non-prescription drugs, or dietary supplements you use. Also tell them if you smoke, drink alcohol, or use illegal drugs. Some items may interact with your medicine. What should I watch for while using this medicine? Visit your doctor or health care professional for regular checks on your progress. Your doctor or health care professional may order blood tests and other tests to see how you are doing. Call your doctor or health care professional for advice if you get a fever, chills or sore throat, or other symptoms of a cold or flu. Do not treat yourself. This drug may decrease your body's ability to fight infection. Try to avoid being around people who are sick. You should make sure you get enough calcium and vitamin D while you are taking this medicine, unless your doctor tells you not to. Discuss the foods you eat and the vitamins you take with your health care professional. See your dentist regularly. Brush and floss your teeth as directed. Before you have any dental work done, tell your dentist you are receiving this medicine. Do not become pregnant while taking this medicine or for 5 months after stopping it. Talk with your doctor or health care professional about your birth control options while taking this medicine. Women should inform their doctor  if they wish to  become pregnant or think they might be pregnant. There is a potential for serious side effects to an unborn child. Talk to your health care professional or pharmacist for more information. What side effects may I notice from receiving this medicine? Side effects that you should report to your doctor or health care professional as soon as possible:  allergic reactions like skin rash, itching or hives, swelling of the face, lips, or tongue  bone pain  breathing problems  dizziness  jaw pain, especially after dental work  redness, blistering, peeling of the skin  signs and symptoms of infection like fever or chills; cough; sore throat; pain or trouble passing urine  signs of low calcium like fast heartbeat, muscle cramps or muscle pain; pain, tingling, numbness in the hands or feet; seizures  unusual bleeding or bruising  unusually weak or tired Side effects that usually do not require medical attention (report to your doctor or health care professional if they continue or are bothersome):  constipation  diarrhea  headache  joint pain  loss of appetite  muscle pain  runny nose  tiredness  upset stomach This list may not describe all possible side effects. Call your doctor for medical advice about side effects. You may report side effects to FDA at 1-800-FDA-1088. Where should I keep my medicine? This medicine is only given in a clinic, doctor's office, or other health care setting and will not be stored at home. NOTE: This sheet is a summary. It may not cover all possible information. If you have questions about this medicine, talk to your doctor, pharmacist, or health care provider.  2020 Elsevier/Gold Standard (2017-05-31 36:62:94)    Nonalcoholic Fatty Liver Disease Diet, Adult Nonalcoholic fatty liver disease is a condition that causes fat to build up in and around the liver. The disease makes it harder for the liver to work the way that it should. Following a  healthy diet can help to keep nonalcoholic fatty liver disease under control. It can also help to prevent or improve conditions that are associated with the disease, such as heart disease, diabetes, high blood pressure, and abnormal cholesterol levels. Along with regular exercise, this diet:  Promotes weight loss.  Helps to control blood sugar levels.  Helps to improve the way that the body uses insulin. What are tips for following this plan? Reading food labels Always check food labels for:  The amount of saturated fat in a food. You should limit your intake of saturated fat. Saturated fat is found in foods that come from animals, including meat and dairy products such as butter, cheese, and whole milk.  The amount of fiber in a food. You should choose high-fiber foods such as fruits, vegetables, and whole grains. Try to get 25-30 grams (g) of fiber a day.  Cooking  When cooking, use heart-healthy oils that are high in monounsaturated fats. These include olive oil, canola oil, and avocado oil.  Limit frying or deep-frying foods. Cook foods using healthy methods such as baking, boiling, steaming, and grilling instead. Meal planning  You may want to keep track of how many calories you take in. Eating the right amount of calories will help you achieve a healthy weight. Meeting with a registered dietitian can help you get started.  Limit how often you eat takeout and fast food. These foods are usually very high in fat, salt, and sugar.  Use the glycemic index (GI) to plan your meals. The index tells you  how quickly a food will raise your blood sugar. Choose low-GI foods (GI less than 55). These foods take a longer time to raise blood sugar. A registered dietitian can help you identify foods lower on the GI scale. Lifestyle  You may want to follow a Mediterranean diet. This diet includes a lot of vegetables, lean meats or fish, whole grains, fruits, and healthy oils and fats. What foods  can I eat?  Fruits Bananas. Apples. Oranges. Grapes. Papaya. Mango. Pomegranate. Kiwi. Grapefruit. Cherries. Vegetables Lettuce. Spinach. Peas. Beets. Cauliflower. Cabbage. Broccoli. Carrots. Tomatoes. Squash. Eggplant. Herbs. Peppers. Onions. Cucumbers. Brussels sprouts. Yams and sweet potatoes. Beans. Lentils. Grains Whole wheat or whole-grain foods, including breads, crackers, cereals, and pasta. Stone-ground whole wheat. Unsweetened oatmeal. Bulgur. Barley. Quinoa. Brown or wild rice. Corn or whole wheat flour tortillas. Meats and other proteins Lean meats. Poultry. Tofu. Seafood and shellfish. Dairy Low-fat or fat-free dairy products, such as yogurt, cottage cheese, or cheese. Beverages Water. Sugar-free drinks. Tea. Coffee. Low-fat or skim milk. Milk alternatives, such as soy or almond milk. Real fruit juice. Fats and oils Avocado. Canola or olive oil. Nuts and nut butters. Seeds. Seasonings and condiments Mustard. Relish. Low-fat, low-sugar ketchup and barbecue sauce. Low-fat or fat-free mayonnaise. Sweets and desserts Sugar-free sweets. The items listed above may not be a complete list of foods and beverages you can eat. Contact a dietitian for more information. What foods should I limit or avoid? Meats and other proteins Limit red meat to 1-2 times a week. Dairy [employer]. Fats and oils Palm oil and coconut oil. Fried foods. Other foods Processed foods. Foods that contain a lot of salt or sodium. Sweets and desserts Sweets that contain sugar. Beverages Sweetened drinks, such as sweet tea, milkshakes, iced sweet drinks, and sodas. Alcohol. The items listed above may not be a complete list of foods and beverages you should avoid. Contact a dietitian for more information. Where to find more information The [employer] of Diabetes and Digestive and Kidney Diseases: [employer] Summary  Nonalcoholic fatty liver disease is a condition that causes fat to build  up in and around the liver.  Following a healthy diet can help to keep nonalcoholic fatty liver disease under control. Your diet should be rich in fruits, vegetables, whole grains, and lean proteins.  Limit your intake of saturated fat. Saturated fat is found in foods that come from animals, including meat and dairy products such as butter, cheese, and whole milk.  This diet promotes weight loss, helps to control blood sugar levels, and helps to improve the way that the body uses insulin. This information is not intended to replace advice given to you by your health care provider. Make sure you discuss any questions you have with your health care provider. Document Revised: 05/16/2018 Document Reviewed: 02/13/2018 Elsevier Patient Education  [place].  Fatty Liver Disease  Fatty liver disease occurs when too much fat has built up in your liver cells. Fatty liver disease is also called hepatic steatosis or steatohepatitis. The liver removes harmful substances from your bloodstream and produces fluids that your body needs. It also helps your body use and store energy from the food you eat. In many cases, fatty liver disease does not cause symptoms or problems. It is often diagnosed when tests are being done for other reasons. However, over time, fatty liver can cause inflammation that may lead to more serious liver problems, such as scarring of the liver (cirrhosis) and liver failure. Fatty liver  is associated with insulin resistance, increased body fat, high blood pressure (hypertension), and high cholesterol. These are features of metabolic syndrome and increase your risk for stroke, diabetes, and heart disease. What are the causes? This condition may be caused by:  Drinking too much alcohol.  Poor nutrition.  Obesity.  Cushing's syndrome.  Diabetes.  High cholesterol.  Certain drugs.  Poisons.  Some viral infections.  Pregnancy. What increases the risk? You are more  likely to develop this condition if you:  Abuse alcohol.  Are overweight.  Have diabetes.  Have hepatitis.  Have a high triglyceride level.  Are pregnant. What are the signs or symptoms? Fatty liver disease often does not cause symptoms. If symptoms do develop, they can include:  Fatigue.  Weakness.  Weight loss.  Confusion.  Abdominal pain.  Nausea and vomiting.  Yellowing of your skin and the white parts of your eyes (jaundice).  Itchy skin. How is this diagnosed? This condition may be diagnosed by:  A physical exam and medical history.  Blood tests.  Imaging tests, such as an ultrasound, CT scan, or MRI.  A liver biopsy. A small sample of liver tissue is removed using a needle. The sample is then looked at under a microscope. How is this treated? Fatty liver disease is often caused by other health conditions. Treatment for fatty liver may involve medicines and lifestyle changes to manage conditions such as:  Alcoholism.  High cholesterol.  Diabetes.  Being overweight or obese. Follow these instructions at home:   Do not drink alcohol. If you have trouble quitting, ask your health care provider how to safely quit with the help of medicine or a supervised program. This is important to keep your condition from getting worse.  Eat a healthy diet as told by your health care provider. Ask your health care provider about working with a diet and nutrition specialist (dietitian) to develop an eating plan.  Exercise regularly. This can help you lose weight and control your cholesterol and diabetes. Talk to your health care provider about an exercise plan and which activities are best for you.  Take over-the-counter and prescription medicines only as told by your health care provider.  Keep all follow-up visits as told by your health care provider. This is important. Contact a health care provider if: You have trouble controlling your:  Blood sugar. This is  especially important if you have diabetes.  Cholesterol.  Drinking of alcohol. Get help right away if:  You have abdominal pain.  You have jaundice.  You have nausea and vomiting.  You vomit blood or material that looks like coffee grounds.  You have stools that are black, tar-like, or bloody. Summary  Fatty liver disease develops when too much fat builds up in the cells of your liver.  Fatty liver disease often causes no symptoms or problems. However, over time, fatty liver can cause inflammation that may lead to more serious liver problems, such as scarring of the liver (cirrhosis).  You are more likely to develop this condition if you abuse alcohol, are pregnant, are overweight, have diabetes, have hepatitis, or have high triglyceride levels.  Contact your health care provider if you have trouble controlling your weight, blood sugar, cholesterol, or drinking of alcohol. This information is not intended to replace advice given to you by your health care provider. Make sure you discuss any questions you have with your health care provider. Document Revised: 01/04/2017 Document Reviewed: 10/31/2016 Elsevier Patient Education  2020 [employer].

## 2019-05-05 NOTE — Telephone Encounter (Signed)
I called pt and left a vm with appt time date and location. 

## 2019-05-05 NOTE — Progress Notes (Signed)
Chief Complaint  Patient presents with  . Follow-up  . Abdominal Pain    pt states she has a twinge, not quite pain, in the RUQ of her abdomen under her rib cage. Ongoing for a year, states before it was discussed this could have something to do with her Galbladder.    F/u  1. tinnitis chronically takes 1/2 xanax 0.25 qhs which helps her fall asleep loud noise in ears will refer to ENT Dr. Tami Ribas 2. Osteoporosis was on fosamax years ago stopped due to c/w side effects and dx'ed age 74 y.o  3. HSV 1 wants Rx valtrex 2 mg bid x 1 dose  4. Hemorrhoids wants Rx proctozone  5. C/o RUQ ab pain at times nausea in the am 6/10 if painful feels like a "twinge" and ab bulges in the right up quadrant to mid quadrant and feels like an air pocket She also had diarrhea 04/06/19 after returning from [facility] and GI path panel negative. Nothing tried   Review of Systems  Constitutional: Negative for weight loss.  HENT: Positive for tinnitus. Negative for hearing loss.   Eyes: Negative for blurred vision.  Respiratory: Negative for shortness of breath.   Cardiovascular: Negative for chest pain.  Gastrointestinal: Positive for abdominal pain and nausea.  Musculoskeletal: Negative for falls.  Skin: Negative for rash.  Neurological: Negative for headaches.  Psychiatric/Behavioral: Negative for memory loss.   Past Medical History:  Diagnosis Date  . Anxiety    did not like the way ssri made her feel ? name  . Colon polyps   . Hemorrhoids   . Hemorrhoids   . Herpes    oral  . Shingles    right back/flank remotely lasting 9 months of pain was on gabapentin  . Tinnitus    Past Surgical History:  Procedure Laterality Date  . dental implants     Family History  Problem Relation Age of Onset  . Cancer Mother        breast later in 26s and uterine  . Breast cancer Mother        late 76's  . Osteoporosis Mother   . Cancer Father        colon  . Cancer Brother        colon  . Breast cancer Maternal  Aunt   . Osteoporosis Maternal Grandmother    Social History   Socioeconomic History  . Marital status: Married    Spouse name: Not on file  . Number of children: Not on file  . Years of education: Not on file  . Highest education level: Not on file  Occupational History  . Not on file  Tobacco Use  . Smoking status: Never Smoker  . Smokeless tobacco: Never Used  Substance and Sexual Activity  . Alcohol use: Yes  . Drug use: Never  . Sexual activity: Yes  Other Topics Concern  . Not on file  Social History Narrative   Moved from [facility] in 3 or 05/2018 to be near son and grandkids    Never smoker    Exercise yes      DPR Husband Beverly Lowe [phone]   Social Determinants of Health   Financial Resource Strain:   . Difficulty of Paying Living Expenses:   Food Insecurity:   . Worried About Charity fundraiser in the Last Year:   . Arboriculturist in the Last Year:   Transportation Needs:   . Lack of Transportation (  Medical):   Marland Kitchen Lack of Transportation (Non-Medical):   Physical Activity:   . Days of Exercise per Week:   . Minutes of Exercise per Session:   Stress:   . Feeling of Stress :   Social Connections:   . Frequency of Communication with Friends and Family:   . Frequency of Social Gatherings with Friends and Family:   . Attends Religious Services:   . Active Member of Clubs or Organizations:   . Attends Archivist Meetings:   Marland Kitchen Marital Status:   Intimate Partner Violence:   . Fear of Current or Ex-Partner:   . Emotionally Abused:   Marland Kitchen Physically Abused:   . Sexually Abused:    No outpatient medications have been marked as taking for the 05/05/19 encounter (Office Visit) with [name], Nino Glow, MD.   No Known Allergies Recent Results (from the past 2160 hour(s))  Gastrointestinal Pathogen Panel PCR     Status: None   Collection Time: 04/07/19  2:24 PM  Result Value Ref Range   Campylobacter, PCR NOT DETECTED NOT DETECT   C.  difficile Tox A/B, PCR NOT DETECTED NOT DETECT   E coli 0157, PCR NOT DETECTED NOT DETECT   E coli (ETEC) LT/ST PCR NOT DETECTED NOT DETECT   E coli (STEC) stx1/stx2, PCR NOT DETECTED NOT DETECT   Salmonella, PCR NOT DETECTED NOT DETECT   Shigella, PCR NOT DETECTED NOT DETECT   Norovirus, PCR NOT DETECTED NOT DETECT   Rotavirus A, PCR NOT DETECTED NOT DETECT   Giardia lamblia, PCR NOT DETECTED NOT DETECT   Cryptosporidium, PCR NOT DETECTED NOT DETECT    Comment: . The xTAG(R) Gastrointestinal Pathogen Panel results are presumptive and must be confirmed by FDA-cleared tests or other acceptable reference methods. The results of this test should not be used as the sole basis for diagnosis, treatment, or other patient management decisions. . Performed using the Luminex xTAG(R) Gastrointestinal Pathogen Panel test kit.    Objective  Body mass index is 22.4 kg/m. Wt Readings from Last 3 Encounters:  05/05/19 112 lb 12.8 oz (51.2 kg)  04/06/19 109 lb (49.4 kg)  09/17/18 110 lb 6.4 oz (50.1 kg)   Temp Readings from Last 3 Encounters:  05/05/19 (!) 97.2 F (36.2 C) (Temporal)  04/06/19 98.2 F (36.8 C)  09/17/18 98 F (36.7 C) (Temporal)   BP Readings from Last 3 Encounters:  05/05/19 130/82  04/06/19 (!) 147/76  09/17/18 130/72   Pulse Readings from Last 3 Encounters:  05/05/19 67  04/06/19 66  09/17/18 (!) 53    Physical Exam Vitals and nursing note reviewed.  Constitutional:      Appearance: Normal appearance. She is well-developed and well-groomed.  HENT:     Head: Normocephalic and atraumatic.  Eyes:     Conjunctiva/sclera: Conjunctivae normal.     Pupils: Pupils are equal, round, and reactive to light.  Cardiovascular:     Rate and Rhythm: Normal rate and regular rhythm.     Heart sounds: Normal heart sounds. No murmur.  Pulmonary:     Effort: Pulmonary effort is normal.     Breath sounds: Normal breath sounds.  Abdominal:     General: Abdomen is flat.  Bowel sounds are normal. There is no distension.     Palpations: Abdomen is soft.     Tenderness: There is no abdominal tenderness.  Skin:    General: Skin is warm and dry.  Neurological:     Mental Status: She is  alert and oriented to person, place, and time.     Gait: Gait normal.  Psychiatric:        Attention and Perception: Attention and perception normal.        Mood and Affect: Mood and affect normal.        Speech: Speech normal.        Behavior: Behavior is cooperative.        Thought Content: Thought content normal.        Cognition and Memory: Cognition and memory normal.        Judgment: Judgment normal.     Assessment  Plan  Right upper quadrant abdominal pain r/o gallstones - Plan: US Abdomen Complete  Tinnitus of both ears - Plan: Ambulatory referral to ENT Dr. Tami Ribas only   Herpes - Plan: valACYclovir (VALTREX) 2000 MG tablet bid  Hemorrhoids, unspecified hemorrhoid type - Plan: hydrocortisone (PROCTOZONE-HC) 2.5 % rectal cream  Osteoporosis, unspecified osteoporosis type, unspecified pathological fracture presence  Declines meds 2/2 side effects  Was on fosamax in the past but has dental implants  HM Flu shot utd  Never had prevnar or pna 23 or shingrix disc'ed today consider  rec Tdap been >10 years consider  covid had 2/2   Pap out of age window  Mammogram 11/2019 ordered had 11/2018  DEXA 11/2018 +osteoporosis T score -4.1  Colonoscopy had in 2019 FH brother and dad colon cancer due in 5 years 2024 h/o polyps personally   Skin: currently no issues h/o cysts removal no skin cancer   Supplements zinc, calcium, vitamin D Provider: Dr. Olivia Mackie [name]-Internal Medicine

## 2019-05-08 ENCOUNTER — Encounter: Payer: Self-pay | Admitting: Internal Medicine

## 2019-05-08 ENCOUNTER — Ambulatory Visit
Admission: RE | Admit: 2019-05-08 | Discharge: 2019-05-08 | Disposition: A | Discharge status: Home / Self Care | Payer: Medicare Other | Source: Ambulatory Visit | Attending: Internal Medicine | Admitting: Internal Medicine

## 2019-05-08 ENCOUNTER — Other Ambulatory Visit: Payer: Self-pay

## 2019-05-08 DIAGNOSIS — R1011 Right upper quadrant pain: Secondary | ICD-10-CM | POA: Diagnosis present

## 2019-05-11 ENCOUNTER — Encounter: Payer: Self-pay | Admitting: Internal Medicine

## 2019-06-09 ENCOUNTER — Other Ambulatory Visit: Payer: Self-pay | Admitting: Internal Medicine

## 2019-06-09 DIAGNOSIS — F419 Anxiety disorder, unspecified: Secondary | ICD-10-CM

## 2019-06-09 MED ORDER — ALPRAZOLAM 0.25 MG PO TABS: 0.1250 mg | ORAL_TABLET | Freq: Every day | ORAL | 5 refills | Status: DC | PRN

## 2019-10-07 ENCOUNTER — Telehealth: Payer: Self-pay | Admitting: Internal Medicine

## 2019-10-07 NOTE — Telephone Encounter (Signed)
Left message for patient to call back and schedule Medicare Annual Wellness Visit (AWV)  ° °This should be a telephone visit only=30 minutes. ° °No hx of AWV; please schedule at anytime with Denisa O'Brien-Blaney at [facility] [place] ° ° °

## 2019-10-21 ENCOUNTER — Ambulatory Visit (INDEPENDENT_AMBULATORY_CARE_PROVIDER_SITE_OTHER): Payer: Medicare Other

## 2019-10-21 VITALS — BP 125/74 | Ht 59.5 in | Wt 106.5 lb

## 2019-10-21 DIAGNOSIS — Z0000 Encounter for general adult medical examination without abnormal findings: Secondary | ICD-10-CM

## 2019-10-21 NOTE — Progress Notes (Signed)
Subjective:   Beverly Lowe is a 74 y.o. female who presents for an Initial Medicare Annual Wellness Visit.  Review of Systems    No ROS.  Medicare Wellness Virtual Visit.    Cardiac Risk Factors include: advanced age (>107men, >16 women)     Objective:    Today's Vitals   10/21/19 1338  BP: 125/74  Weight: 106 lb 8 oz (48.3 kg)  Height: 4' 11.5" (1.511 m)   Body mass index is 21.15 kg/m.  Advanced Directives 10/21/2019  Does Patient Have a Medical Advance Directive? No  Would patient like information on creating a medical advance directive? No - Patient declined    Current Medications (verified) Outpatient Encounter Medications as of 10/21/2019  Medication Sig  . ALPRAZolam (XANAX) 0.25 MG tablet Take 0.5 tablets (0.125 mg total) by mouth daily as needed for anxiety.  . hydrocortisone (PROCTOZONE-HC) 2.5 % rectal cream Place 1 application rectally 2 (two) times daily. Prn  . valACYclovir (VALTREX) 1000 MG tablet Take 2 tablets (2,000 mg total) by mouth 2 (two) times daily as needed. X 1 day outbreak prn   No facility-administered encounter medications on file as of 10/21/2019.    Allergies (verified) Patient has no known allergies.   History: Past Medical History:  Diagnosis Date  . Anxiety    did not like the way ssri made her feel ? name  . Colon polyps   . Hemorrhoids   . Hemorrhoids   . Herpes    oral  . Shingles    right back/flank remotely lasting 9 months of pain was on gabapentin  . Tinnitus    Past Surgical History:  Procedure Laterality Date  . dental implants     Family History  Problem Relation Age of Onset  . Cancer Mother        breast later in 42s and uterine  . Breast cancer Mother        late 34's  . Osteoporosis Mother   . Cancer Father        colon  . Cancer Brother        colon  . Breast cancer Maternal Aunt   . Osteoporosis Maternal Grandmother    Social History   Socioeconomic History  . Marital status: Married     Spouse name: Not on file  . Number of children: Not on file  . Years of education: Not on file  . Highest education level: Not on file  Occupational History  . Not on file  Tobacco Use  . Smoking status: Never Smoker  . Smokeless tobacco: Never Used  Vaping Use  . Vaping Use: Never used  Substance and Sexual Activity  . Alcohol use: Yes  . Drug use: Never  . Sexual activity: Yes  Other Topics Concern  . Not on file  Social History Narrative   Moved from [facility] in 3 or 05/2018 to be near son and grandkids    Never smoker    Exercise yes      DPR Husband Marypat Kimmet [phone]   Social Determinants of Health   Financial Resource Strain: Low Risk   . Difficulty of Paying Living Expenses: Not hard at all  Food Insecurity: No Food Insecurity  . Worried About Programme researcher, broadcasting/film/video in the Last Year: Never true  . Ran Out of Food in the Last Year: Never true  Transportation Needs: No Transportation Needs  . Lack of Transportation (Medical): No  . Lack of Transportation (  Non-Medical): No  Physical Activity: Sufficiently Active  . Days of Exercise per Week: 5 days  . Minutes of Exercise per Session: 30 min  Stress: No Stress Concern Present  . Feeling of Stress : Not at all  Social Connections:   . Frequency of Communication with Friends and Family: Not on file  . Frequency of Social Gatherings with Friends and Family: Not on file  . Attends Religious Services: Not on file  . Active Member of Clubs or Organizations: Not on file  . Attends Banker Meetings: Not on file  . Marital Status: Not on file    Tobacco Counseling Counseling given: Not Answered   Clinical Intake:  Pre-visit preparation completed: Yes           How often do you need to have someone help you when you read instructions, pamphlets, or other written materials from your doctor or pharmacy?: 1 - Never    Interpreter Needed?: No      Activities of Daily Living In your  present state of health, do you have any difficulty performing the following activities: 10/21/2019  Hearing? N  Vision? N  Difficulty concentrating or making decisions? N  Walking or climbing stairs? N  Dressing or bathing? N  Doing errands, shopping? N  Preparing Food and eating ? N  Using the Toilet? N  In the past six months, have you accidently leaked urine? N  Do you have problems with loss of bowel control? N  Managing your Medications? N  Managing your Finances? N  Housekeeping or managing your Housekeeping? N  Some recent data might be hidden    Patient Care Team: [name], Pasty Spillers, MD as PCP - General (Internal Medicine)  Indicate any recent Medical Services you may have received from other than [facility] providers in the past year (date may be approximate).     Assessment:   This is a routine wellness examination for [facility].  I connected with Vora today by telephone and verified that I am speaking with the correct person using two identifiers. Location patient: home Location provider: work Persons participating in the virtual visit: patient, Engineer, civil (consulting).    I discussed the limitations, risks, security and privacy concerns of performing an evaluation and management service by telephone and the availability of in person appointments. The patient expressed understanding and verbally consented to this telephonic visit.    Interactive audio and video telecommunications were attempted between this provider and patient, however failed, due to patient having technical difficulties OR patient did not have access to video capability.  We continued and completed visit with audio only.  Some vital signs may be absent or patient reported.   Hearing/Vision screen  Hearing Screening   125Hz  250Hz  500Hz  1000Hz  2000Hz  3000Hz  4000Hz  6000Hz  8000Hz   Right ear:           Left ear:           Comments: Patient is able to hear conversational tones without difficulty.  No issues  reported.   Vision Screening Comments: Visual acuity not assessed, virtual visit.       Dietary issues and exercise activities discussed: Current Exercise Habits: Home exercise routine, Type of exercise: calisthenics;strength training/weights;stretching, Time (Minutes): 30, Frequency (Times/Week): 5, Weekly Exercise (Minutes/Week): 150, Intensity: Mild Lactose intolerant Low carb; gluten free Good intake    Goals      Patient Stated   .  Maintain Healthy Lifestyle (pt-stated)      Depression Screen PHQ 2/9 Scores  10/21/2019 05/05/2019 09/17/2018  PHQ - 2 Score 0 0 0  PHQ- 9 Score - - 1    Fall Risk Fall Risk  10/21/2019 05/05/2019 09/17/2018  Falls in the past year? 0 0 0  Number falls in past yr: 0 0 0  Injury with Fall? - 0 0  Follow up Falls evaluation completed Falls evaluation completed Falls evaluation completed   Handrails in use when climbing stairs? Yes Home free of loose throw rugs in walkways, pet beds, electrical cords, etc? Yes  Adequate lighting in your home to reduce risk of falls? Yes   ASSISTIVE DEVICES UTILIZED TO PREVENT FALLS: Life alert? No  Use of a cane, walker or w/c? No    TIMED UP AND GO:  Was the test performed? No .  Virtual visit.   Cognitive Function:  Patient is alert and oriented x3.  Denies difficulty focusing, making decisions, memory loss.   Enjoys brain challenging activities like online games, cards, bunko and reading for brain health.     Immunizations Immunization History  Administered Date(s) Administered  . Fluad Quad(high Dose 65+) 10/03/2018  . PFIZER SARS-COV-2 Vaccination 02/12/2019, 03/05/2019, 10/05/2019    TDAP status: Due, Education has been provided regarding the importance of this vaccine. Advised may receive this vaccine at local pharmacy or Health Dept. Aware to provide a copy of the vaccination record if obtained from local pharmacy or Health Dept. Verbalized acceptance and understanding. Deferred.   Health  Maintenance There are no preventive care reminders to display for this patient.  Health Maintenance  Topic Date Due  . INFLUENZA VACCINE  05/05/2020 (Originally 09/06/2019)  . TETANUS/TDAP  10/20/2020 (Originally 03/30/1964)  . PNA vac Low Risk Adult (1 of 2 - PCV13) 10/20/2020 (Originally 03/29/2009)  . COLONOSCOPY  10/26/2027  . DEXA SCAN  Completed  . COVID-19 Vaccine  Completed  . Hepatitis C Screening  Completed   Influenza vaccine- deferred per patient.  Pneumococcal vaccine- deferred per patient.    Dental Screening: Recommended annual dental exams for proper oral hygiene.  Community Resource Referral / Chronic Care Management: CRR required this visit?  No   CCM required this visit?  No      Plan:   Keep all routine maintenance appointments.   Follow up 11/05/19 @ 10:00; C/O intermittent heat R side lower abd. Denies pain, nausea and all other symptoms.   I have personally reviewed and noted the following in the patient's chart:   . Medical and social history . Use of alcohol, tobacco or illicit drugs  . Current medications and supplements . Functional ability and status . Nutritional status . Physical activity . Advanced directives . List of other physicians . Hospitalizations, surgeries, and ER visits in previous 12 months . Vitals . Screenings to include cognitive, depression, and falls . Referrals and appointments  In addition, I have reviewed and discussed with patient certain preventive protocols, quality metrics, and best practice recommendations. A written personalized care plan for preventive services as well as general preventive health recommendations were provided to patient via mychart.     Ashok Pall, LPN   [phone]/[id number]

## 2019-10-21 NOTE — Patient Instructions (Addendum)
Beverly Lowe , Thank you for taking time to come for your Medicare Wellness Visit. I appreciate your ongoing commitment to your health goals. Please review the following plan we discussed and let me know if I can assist you in the future.   These are the goals we discussed: Goals      Patient Stated   .  Maintain Healthy Lifestyle (pt-stated)       This is a list of the screening recommended for you and due dates:  Health Maintenance  Topic Date Due  . Flu Shot  05/05/2020*  . Tetanus Vaccine  10/20/2020*  . Pneumonia vaccines (1 of 2 - PCV13) 10/20/2020*  . Colon Cancer Screening  10/26/2027  . DEXA scan (bone density measurement)  Completed  . COVID-19 Vaccine  Completed  .  Hepatitis C: One time screening is recommended by Center for Disease Control  (CDC) for  adults born from [birth year] through 1966.   Completed  *Topic was postponed. The date shown is not the original due date.    Immunizations Immunization History  Administered Date(s) Administered  . Fluad Quad(high Dose 65+) 10/03/2018  . PFIZER SARS-COV-2 Vaccination 02/12/2019, 03/05/2019, 10/05/2019   Conditions/risks identified: none new.  Follow up in one year for your annual wellness visit.   Preventive Care 24 Years and Older, Female Preventive care refers to lifestyle choices and visits with your health care provider that can promote health and wellness. What does preventive care include?  A yearly physical exam. This is also called an annual well check.  Dental exams once or twice a year.  Routine eye exams. Ask your health care provider how often you should have your eyes checked.  Personal lifestyle choices, including:  Daily care of your teeth and gums.  Regular physical activity.  Eating a healthy diet.  Avoiding tobacco and drug use.  Limiting alcohol use.  Practicing safe sex.  Taking low-dose aspirin every day.  Taking vitamin and mineral supplements as recommended by your health care  provider. What happens during an annual well check? The services and screenings done by your health care provider during your annual well check will depend on your age, overall health, lifestyle risk factors, and family history of disease. Counseling  Your health care provider may ask you questions about your:  Alcohol use.  Tobacco use.  Drug use.  Emotional well-being.  Home and relationship well-being.  Sexual activity.  Eating habits.  History of falls.  Memory and ability to understand (cognition).  Work and work Astronomer.  Reproductive health. Screening  You may have the following tests or measurements:  Height, weight, and BMI.  Blood pressure.  Lipid and cholesterol levels. These may be checked every 5 years, or more frequently if you are over 49 years old.  Skin check.  Lung cancer screening. You may have this screening every year starting at age 76 if you have a 30-pack-year history of smoking and currently smoke or have quit within the past 15 years.  Fecal occult blood test (FOBT) of the stool. You may have this test every year starting at age 2.  Flexible sigmoidoscopy or colonoscopy. You may have a sigmoidoscopy every 5 years or a colonoscopy every 10 years starting at age 46.  Hepatitis C blood test.  Hepatitis B blood test.  Sexually transmitted disease (STD) testing.  Diabetes screening. This is done by checking your blood sugar (glucose) after you have not eaten for a while (fasting). You may have this done  every 1-3 years.  Bone density scan. This is done to screen for osteoporosis. You may have this done starting at age 31.  Mammogram. This may be done every 1-2 years. Talk to your health care provider about how often you should have regular mammograms. Talk with your health care provider about your test results, treatment options, and if necessary, the need for more tests. Vaccines  Your health care provider may recommend certain  vaccines, such as:  Influenza vaccine. This is recommended every year.  Tetanus, diphtheria, and acellular pertussis (Tdap, Td) vaccine. You may need a Td booster every 10 years.  Zoster vaccine. You may need this after age 78.  Pneumococcal 13-valent conjugate (PCV13) vaccine. One dose is recommended after age 78.  Pneumococcal polysaccharide (PPSV23) vaccine. One dose is recommended after age 28. Talk to your health care provider about which screenings and vaccines you need and how often you need them. This information is not intended to replace advice given to you by your health care provider. Make sure you discuss any questions you have with your health care provider. Document Released: 02/18/2015 Document Revised: 10/12/2015 Document Reviewed: 11/23/2014 Elsevier Interactive Patient Education  2017 [employer].  Fall Prevention in the Home Falls can cause injuries. They can happen to people of all ages. There are many things you can do to make your home safe and to help prevent falls. What can I do on the outside of my home?  Regularly fix the edges of walkways and driveways and fix any cracks.  Remove anything that might make you trip as you walk through a door, such as a raised step or threshold.  Trim any bushes or trees on the path to your home.  Use bright outdoor lighting.  Clear any walking paths of anything that might make someone trip, such as rocks or tools.  Regularly check to see if handrails are loose or broken. Make sure that both sides of any steps have handrails.  Any raised decks and porches should have guardrails on the edges.  Have any leaves, snow, or ice cleared regularly.  Use sand or salt on walking paths during winter.  Clean up any spills in your garage right away. This includes oil or grease spills. What can I do in the bathroom?  Use night lights.  Install grab bars by the toilet and in the tub and shower. Do not use towel bars as grab  bars.  Use non-skid mats or decals in the tub or shower.  If you need to sit down in the shower, use a plastic, non-slip stool.  Keep the floor dry. Clean up any water that spills on the floor as soon as it happens.  Remove soap buildup in the tub or shower regularly.  Attach bath mats securely with double-sided non-slip rug tape.  Do not have throw rugs and other things on the floor that can make you trip. What can I do in the bedroom?  Use night lights.  Make sure that you have a light by your bed that is easy to reach.  Do not use any sheets or blankets that are too big for your bed. They should not hang down onto the floor.  Have a firm chair that has side arms. You can use this for support while you get dressed.  Do not have throw rugs and other things on the floor that can make you trip. What can I do in the kitchen?  Clean up any spills right  away.  Avoid walking on wet floors.  Keep items that you use a lot in easy-to-reach places.  If you need to reach something above you, use a strong step stool that has a grab bar.  Keep electrical cords out of the way.  Do not use floor polish or wax that makes floors slippery. If you must use wax, use non-skid floor wax.  Do not have throw rugs and other things on the floor that can make you trip. What can I do with my stairs?  Do not leave any items on the stairs.  Make sure that there are handrails on both sides of the stairs and use them. Fix handrails that are broken or loose. Make sure that handrails are as long as the stairways.  Check any carpeting to make sure that it is firmly attached to the stairs. Fix any carpet that is loose or worn.  Avoid having throw rugs at the top or bottom of the stairs. If you do have throw rugs, attach them to the floor with carpet tape.  Make sure that you have a light switch at the top of the stairs and the bottom of the stairs. If you do not have them, ask someone to add them for  you. What else can I do to help prevent falls?  Wear shoes that:  Do not have high heels.  Have rubber bottoms.  Are comfortable and fit you well.  Are closed at the toe. Do not wear sandals.  If you use a stepladder:  Make sure that it is fully opened. Do not climb a closed stepladder.  Make sure that both sides of the stepladder are locked into place.  Ask someone to hold it for you, if possible.  Clearly mark and make sure that you can see:  Any grab bars or handrails.  First and last steps.  Where the edge of each step is.  Use tools that help you move around (mobility aids) if they are needed. These include:  Canes.  Walkers.  Scooters.  Crutches.  Turn on the lights when you go into a dark area. Replace any light bulbs as soon as they burn out.  Set up your furniture so you have a clear path. Avoid moving your furniture around.  If any of your floors are uneven, fix them.  If there are any pets around you, be aware of where they are.  Review your medicines with your doctor. Some medicines can make you feel dizzy. This can increase your chance of falling. Ask your doctor what other things that you can do to help prevent falls. This information is not intended to replace advice given to you by your health care provider. Make sure you discuss any questions you have with your health care provider. Document Released: 11/18/2008 Document Revised: 06/30/2015 Document Reviewed: 02/26/2014 Elsevier Interactive Patient Education  2017 [employer].

## 2019-11-05 ENCOUNTER — Other Ambulatory Visit: Payer: Self-pay

## 2019-11-05 ENCOUNTER — Ambulatory Visit (INDEPENDENT_AMBULATORY_CARE_PROVIDER_SITE_OTHER): Payer: Medicare Other | Admitting: Internal Medicine

## 2019-11-05 ENCOUNTER — Encounter: Payer: Self-pay | Admitting: Internal Medicine

## 2019-11-05 VITALS — BP 126/70 | HR 67 | Temp 98.0°F | Ht 59.5 in | Wt 107.8 lb

## 2019-11-05 DIAGNOSIS — Z1329 Encounter for screening for other suspected endocrine disorder: Secondary | ICD-10-CM

## 2019-11-05 DIAGNOSIS — E785 Hyperlipidemia, unspecified: Secondary | ICD-10-CM

## 2019-11-05 DIAGNOSIS — R1031 Right lower quadrant pain: Secondary | ICD-10-CM

## 2019-11-05 DIAGNOSIS — R102 Pelvic and perineal pain unspecified side: Secondary | ICD-10-CM

## 2019-11-05 DIAGNOSIS — Z23 Encounter for immunization: Secondary | ICD-10-CM

## 2019-11-05 DIAGNOSIS — R634 Abnormal weight loss: Secondary | ICD-10-CM

## 2019-11-05 DIAGNOSIS — K623 Rectal prolapse: Secondary | ICD-10-CM

## 2019-11-05 DIAGNOSIS — Z1231 Encounter for screening mammogram for malignant neoplasm of breast: Secondary | ICD-10-CM | POA: Diagnosis not present

## 2019-11-05 DIAGNOSIS — K5900 Constipation, unspecified: Secondary | ICD-10-CM

## 2019-11-05 NOTE — Patient Instructions (Addendum)
Hold on treatments for osteoporosis  Calcium 600 mg 1-2 x per day  Vitamin D3 2000 IU daily  Call and schedule mammogram [phone]  Denosumab injection What is this medicine? DENOSUMAB (den oh sue mab) slows bone breakdown. Prolia is used to treat osteoporosis in women after menopause and in men, and in people who are taking corticosteroids for 6 months or more. Delton See is used to treat a high calcium level due to cancer and to prevent bone fractures and other bone problems caused by multiple myeloma or cancer bone metastases. Delton See is also used to treat giant cell tumor of the bone. This medicine may be used for other purposes; ask your health care provider or pharmacist if you have questions. COMMON BRAND NAME(S): Prolia, XGEVA What should I tell my health care provider before I take this medicine? They need to know if you have any of these conditions:  dental disease  having surgery or tooth extraction  infection  kidney disease  low levels of calcium or Vitamin D in the blood  malnutrition  on hemodialysis  skin conditions or sensitivity  thyroid or parathyroid disease  an unusual reaction to denosumab, other medicines, foods, dyes, or preservatives  pregnant or trying to get pregnant  breast-feeding How should I use this medicine? This medicine is for injection under the skin. It is given by a health care professional in a hospital or clinic setting. A special MedGuide will be given to you before each treatment. Be sure to read this information carefully each time. For Prolia, talk to your pediatrician regarding the use of this medicine in children. Special care may be needed. For Delton See, talk to your pediatrician regarding the use of this medicine in children. While this drug may be prescribed for children as young as 13 years for selected conditions, precautions do apply. Overdosage: If you think you have taken too much of this medicine contact a poison control center  or emergency room at once. NOTE: This medicine is only for you. Do not share this medicine with others. What if I miss a dose? It is important not to miss your dose. Call your doctor or health care professional if you are unable to keep an appointment. What may interact with this medicine? Do not take this medicine with any of the following medications:  other medicines containing denosumab This medicine may also interact with the following medications:  medicines that lower your chance of fighting infection  steroid medicines like prednisone or cortisone This list may not describe all possible interactions. Give your health care provider a list of all the medicines, herbs, non-prescription drugs, or dietary supplements you use. Also tell them if you smoke, drink alcohol, or use illegal drugs. Some items may interact with your medicine. What should I watch for while using this medicine? Visit your doctor or health care professional for regular checks on your progress. Your doctor or health care professional may order blood tests and other tests to see how you are doing. Call your doctor or health care professional for advice if you get a fever, chills or sore throat, or other symptoms of a cold or flu. Do not treat yourself. This drug may decrease your body's ability to fight infection. Try to avoid being around people who are sick. You should make sure you get enough calcium and vitamin D while you are taking this medicine, unless your doctor tells you not to. Discuss the foods you eat and the vitamins you take with  your health care professional. See your dentist regularly. Brush and floss your teeth as directed. Before you have any dental work done, tell your dentist you are receiving this medicine. Do not become pregnant while taking this medicine or for 5 months after stopping it. Talk with your doctor or health care professional about your birth control options while taking this medicine.  Women should inform their doctor if they wish to become pregnant or think they might be pregnant. There is a potential for serious side effects to an unborn child. Talk to your health care professional or pharmacist for more information. What side effects may I notice from receiving this medicine? Side effects that you should report to your doctor or health care professional as soon as possible:  allergic reactions like skin rash, itching or hives, swelling of the face, lips, or tongue  bone pain  breathing problems  dizziness  jaw pain, especially after dental work  redness, blistering, peeling of the skin  signs and symptoms of infection like fever or chills; cough; sore throat; pain or trouble passing urine  signs of low calcium like fast heartbeat, muscle cramps or muscle pain; pain, tingling, numbness in the hands or feet; seizures  unusual bleeding or bruising  unusually weak or tired Side effects that usually do not require medical attention (report to your doctor or health care professional if they continue or are bothersome):  constipation  diarrhea  headache  joint pain  loss of appetite  muscle pain  runny nose  tiredness  upset stomach This list may not describe all possible side effects. Call your doctor for medical advice about side effects. You may report side effects to FDA at 1-800-FDA-1088. Where should I keep my medicine? This medicine is only given in a clinic, doctor's office, or other health care setting and will not be stored at home. NOTE: This sheet is a summary. It may not cover all possible information. If you have questions about this medicine, talk to your doctor, pharmacist, or health care provider.  2020 Elsevier/Gold Standard (2017-05-31 16:10:44)  Zoledronic Acid injection (Paget's Disease, Osteoporosis) What is this medicine? ZOLEDRONIC ACID (ZOE le dron ik AS id) lowers the amount of calcium loss from bone. It is used to treat  Paget's disease and osteoporosis in women. This medicine may be used for other purposes; ask your health care provider or pharmacist if you have questions. COMMON BRAND NAME(S): Reclast, Zometa What should I tell my health care provider before I take this medicine? They need to know if you have any of these conditions:  aspirin-sensitive asthma  cancer, especially if you are receiving medicines used to treat cancer  dental disease or wear dentures  infection  kidney disease  low levels of calcium in the blood  past surgery on the parathyroid gland or intestines  receiving corticosteroids like dexamethasone or prednisone  an unusual or allergic reaction to zoledronic acid, other medicines, foods, dyes, or preservatives  pregnant or trying to get pregnant  breast-feeding How should I use this medicine? This medicine is for infusion into a vein. It is given by a health care professional in a hospital or clinic setting. Talk to your pediatrician regarding the use of this medicine in children. This medicine is not approved for use in children. Overdosage: If you think you have taken too much of this medicine contact a poison control center or emergency room at once. NOTE: This medicine is only for you. Do not share this medicine with  others. What if I miss a dose? It is important not to miss your dose. Call your doctor or health care professional if you are unable to keep an appointment. What may interact with this medicine?  certain antibiotics given by injection  NSAIDs, medicines for pain and inflammation, like ibuprofen or naproxen  some diuretics like bumetanide, furosemide  teriparatide This list may not describe all possible interactions. Give your health care provider a list of all the medicines, herbs, non-prescription drugs, or dietary supplements you use. Also tell them if you smoke, drink alcohol, or use illegal drugs. Some items may interact with your  medicine. What should I watch for while using this medicine? Visit your doctor or health care professional for regular checkups. It may be some time before you see the benefit from this medicine. Do not stop taking your medicine unless your doctor tells you to. Your doctor may order blood tests or other tests to see how you are doing. Women should inform their doctor if they wish to become pregnant or think they might be pregnant. There is a potential for serious side effects to an unborn child. Talk to your health care professional or pharmacist for more information. You should make sure that you get enough calcium and vitamin D while you are taking this medicine. Discuss the foods you eat and the vitamins you take with your health care professional. Some people who take this medicine have severe bone, joint, and/or muscle pain. This medicine may also increase your risk for jaw problems or a broken thigh bone. Tell your doctor right away if you have severe pain in your jaw, bones, joints, or muscles. Tell your doctor if you have any pain that does not go away or that gets worse. Tell your dentist and dental surgeon that you are taking this medicine. You should not have major dental surgery while on this medicine. See your dentist to have a dental exam and fix any dental problems before starting this medicine. Take good care of your teeth while on this medicine. Make sure you see your dentist for regular follow-up appointments. What side effects may I notice from receiving this medicine? Side effects that you should report to your doctor or health care professional as soon as possible:  allergic reactions like skin rash, itching or hives, swelling of the face, lips, or tongue  anxiety, confusion, or depression  breathing problems  changes in vision  eye pain  feeling faint or lightheaded, falls  jaw pain, especially after dental work  mouth sores  muscle cramps, stiffness, or  weakness  redness, blistering, peeling or loosening of the skin, including inside the mouth  trouble passing urine or change in the amount of urine Side effects that usually do not require medical attention (report to your doctor or health care professional if they continue or are bothersome):  bone, joint, or muscle pain  constipation  diarrhea  fever  hair loss  irritation at site where injected  loss of appetite  nausea, vomiting  stomach upset  trouble sleeping  trouble swallowing  weak or tired This list may not describe all possible side effects. Call your doctor for medical advice about side effects. You may report side effects to FDA at 1-800-FDA-1088. Where should I keep my medicine? This drug is given in a hospital or clinic and will not be stored at home. NOTE: This sheet is a summary. It may not cover all possible information. If you have questions about this medicine,  talk to your doctor, pharmacist, or health care provider.  2020 Elsevier/Gold Standard (2013-06-20 14:19:57)

## 2019-11-05 NOTE — Progress Notes (Addendum)
Chief Complaint  Patient presents with  . Follow-up  . Flank Pain    right sided flank irritation, hotness   F/u  1. C/o right lower pelvis irritation/pain at times hot x few weeks to 1 month and ? Trigger intermittent nothing tried denies low back pain and right hip pain still has all of female organs but she is concerned as to what is going on  She does at times have gas not sure if this is related  2. Osteoporosis declines medications for tx this for now prolia also disc reclast c/w side effects   Review of Systems  Constitutional: Positive for weight loss.       Lost wt trying   HENT: Negative for hearing loss.   Eyes: Negative for blurred vision.  Respiratory: Negative for shortness of breath.   Cardiovascular: Negative for chest pain.  Gastrointestinal: Positive for abdominal pain.  Musculoskeletal: Negative for back pain, falls and joint pain.  Skin: Negative for rash.  Psychiatric/Behavioral: Negative for depression.   Past Medical History:  Diagnosis Date  . Anxiety    did not like the way ssri made her feel ? name  . Colon polyps   . Hemorrhoids   . Hemorrhoids   . Herpes    oral  . Shingles    right back/flank remotely lasting 9 months of pain was on gabapentin  . Tinnitus    Past Surgical History:  Procedure Laterality Date  . dental implants     Family History  Problem Relation Age of Onset  . Cancer Mother        breast later in 25s and uterine  . Breast cancer Mother        late 35's  . Osteoporosis Mother   . Cancer Father        colon  . Cancer Brother        colon  . Breast cancer Maternal Aunt   . Osteoporosis Maternal Grandmother    Social History   Socioeconomic History  . Marital status: Married    Spouse name: Not on file  . Number of children: Not on file  . Years of education: Not on file  . Highest education level: Not on file  Occupational History  . Not on file  Tobacco Use  . Smoking status: Never Smoker  . Smokeless  tobacco: Never Used  Vaping Use  . Vaping Use: Never used  Substance and Sexual Activity  . Alcohol use: Yes  . Drug use: Never  . Sexual activity: Yes  Other Topics Concern  . Not on file  Social History Narrative   Moved from [facility] in 3 or 05/2018 to be near son and grandkids    Never smoker    Exercise yes      DPR Husband Dawnita Molner [phone]   Social Determinants of Health   Financial Resource Strain: Low Risk   . Difficulty of Paying Living Expenses: Not hard at all  Food Insecurity: No Food Insecurity  . Worried About Programme researcher, broadcasting/film/video in the Last Year: Never true  . Ran Out of Food in the Last Year: Never true  Transportation Needs: No Transportation Needs  . Lack of Transportation (Medical): No  . Lack of Transportation (Non-Medical): No  Physical Activity: Sufficiently Active  . Days of Exercise per Week: 5 days  . Minutes of Exercise per Session: 30 min  Stress: No Stress Concern Present  . Feeling of Stress : Not at  all  Social Connections:   . Frequency of Communication with Friends and Family: Not on file  . Frequency of Social Gatherings with Friends and Family: Not on file  . Attends Religious Services: Not on file  . Active Member of Clubs or Organizations: Not on file  . Attends Banker Meetings: Not on file  . Marital Status: Not on file  Intimate Partner Violence: Not At Risk  . Fear of Current or Ex-Partner: No  . Emotionally Abused: No  . Physically Abused: No  . Sexually Abused: No   Current Meds  Medication Sig  . ALPRAZolam (XANAX) 0.25 MG tablet Take 0.5 tablets (0.125 mg total) by mouth daily as needed for anxiety.  . hydrocortisone (PROCTOZONE-HC) 2.5 % rectal cream Place 1 application rectally 2 (two) times daily. Prn  . valACYclovir (VALTREX) 1000 MG tablet Take 2 tablets (2,000 mg total) by mouth 2 (two) times daily as needed. X 1 day outbreak prn   No Known Allergies No results found for this or any previous  visit (from the past 2160 hour(s)). Objective  Body mass index is 21.41 kg/m. Wt Readings from Last 3 Encounters:  11/05/19 107 lb 12.8 oz (48.9 kg)  10/21/19 106 lb 8 oz (48.3 kg)  05/05/19 112 lb 12.8 oz (51.2 kg)   Temp Readings from Last 3 Encounters:  11/05/19 98 F (36.7 C) (Oral)  05/05/19 (!) 97.2 F (36.2 C) (Temporal)  04/06/19 98.2 F (36.8 C)   BP Readings from Last 3 Encounters:  11/05/19 126/70  10/21/19 125/74  05/05/19 130/82   Pulse Readings from Last 3 Encounters:  11/05/19 67  05/05/19 67  04/06/19 66    Physical Exam Vitals and nursing note reviewed.  Constitutional:      Appearance: Normal appearance. She is well-developed and well-groomed.  HENT:     Head: Normocephalic and atraumatic.  Eyes:     Conjunctiva/sclera: Conjunctivae normal.     Pupils: Pupils are equal, round, and reactive to light.  Cardiovascular:     Rate and Rhythm: Normal rate and regular rhythm.     Heart sounds: Normal heart sounds. No murmur heard.   Pulmonary:     Effort: Pulmonary effort is normal.     Breath sounds: Normal breath sounds.  Abdominal:     General: Abdomen is flat. Bowel sounds are normal.     Tenderness: There is abdominal tenderness in the right lower quadrant.  Skin:    General: Skin is warm and dry.  Neurological:     General: No focal deficit present.     Mental Status: She is alert and oriented to person, place, and time. Mental status is at baseline.     Gait: Gait normal.  Psychiatric:        Attention and Perception: Attention and perception normal.        Mood and Affect: Mood and affect normal.        Speech: Speech normal.        Behavior: Behavior normal. Behavior is cooperative.        Thought Content: Thought content normal.        Cognition and Memory: Cognition normal.        Judgment: Judgment normal.    11/23/19 CT pelvis ADDENDUM REPORT: 11/25/2019 19:27  ADDENDUM: Study discussed by telephone with Dr. Quentin Ore on 11/25/2019 at 1907 hours.  We discussed the normal findings on this exam (appendix) as well as the fairly mild findings of  pelvic floor laxity (primarily involving the right base of the bladder as seen on coronal image 68), and possible gastric ptosis (greater curve of the stomach visible just below the level of the umbilicus on this supine exam) - both of which could be asymptomatic.   Electronically Signed   By: Odessa Fleming M.D.   On: 11/25/2019 19:27   Signed by Princella Pellegrini, MD on 11/25/2019 7:30 PM  Narrative & Impression  CLINICAL DATA:  74 year old female with postmenopausal pelvic pain. Right lower quadrant pain. Right side pelvic symptoms x1 month.  EXAM: CT PELVIS WITHOUT CONTRAST  TECHNIQUE: Multidetector CT imaging of the pelvis was performed following the standard protocol without intravenous contrast.  COMPARISON:  None.  FINDINGS: Urinary Tract: Kidneys are not included. No hydroureter. Mildly to moderately distended urinary bladder, estimated bladder volume 250 mL. No bladder wall thickening or perivesical stranding.  Bowel: Oral contrast in small bowel, visible greater curve of the stomach (gastric ptosis suspected) and large bowel to the level of the mid transverse colon (redundant. Retained stool in the right and transverse colon. Decompressed descending and rectosigmoid colon. No dilated or inflamed distal small bowel.  The cecum is partially located in the right anterior pelvis. Normal appendix which arises on series 2, image 28, partially contains gas and oral contrast, and tracks along the posterior right pelvic side wall (coronal image 70). No free air or free fluid.  Vascular/Lymphatic: No IV contrast administered. No atherosclerosis is evident.  No lymphadenopathy.  Reproductive: Negative noncontrast uterus and adnexa. However, there is pelvic floor laxity (series 2, image 41) with downward protrusion of the  right base of the bladder, vagina, and a portion of the rectum.  Other:  No pelvic free fluid.  Musculoskeletal: Partially visible dextroconvex lumbar scoliosis. Mild for age degenerative changes in the lower lumbar spine and pelvis. No acute osseous abnormality identified.  IMPRESSION: 1. Positive for pelvic floor laxity with partial prolapse of the urinary bladder, vagina, and rectum.  2. Otherwise negative CT appearance of the pelvis. Normal appendix, located along the posterior right pelvic side wall.  Electronically Signed: By: Odessa Fleming M.D. On: 11/23/2019 23:31      Assessment  Plan  Female pelvic pain/RLQ ab pain r/o UTI vs GI vs GU etiology- Plan: CT Abdomen Pelvis Wo Contrast, Urinalysis, Routine w reflex microscopic, Urine Culture  Hyperlipidemia, unspecified hyperlipidemia type - Plan: Lipid panel  HM Flu shot utd given today  Never had prevnar or pna 23 or shingrix disc'ed today consider  rec Tdap been >10 years consider  covid had 2/2 + booster day  Pap out of age window  Mammogram 11/2019 ordered had 11/2018  DEXA 11/2018 +osteoporosis T score -4.1  -declines prolia or meds to tx 11/05/19   Colonoscopy had in 2019 FH brother and dad colon cancer due in 5 years9/20/2024 h/o polyps personally mod IH, divert.tortuous colon,x2 polpys Dr. Elwyn Lade 10/25/17   Skin: currently no issues h/o cysts removal no skin cancer  Supplements zinc, calcium, vitamin D Provider: Dr. [name] [name]-Internal Medicine

## 2019-11-06 LAB — URINE CULTURE
MICRO NUMBER:: 11015536
Result:: NO GROWTH
SPECIMEN QUALITY:: ADEQUATE

## 2019-11-06 LAB — URINALYSIS, ROUTINE W REFLEX MICROSCOPIC
Bacteria, UA: NONE SEEN /HPF
Bilirubin Urine: NEGATIVE
Glucose, UA: NEGATIVE
Hgb urine dipstick: NEGATIVE
Hyaline Cast: NONE SEEN /LPF
Ketones, ur: NEGATIVE
Nitrite: NEGATIVE
Protein, ur: NEGATIVE
RBC / HPF: NONE SEEN /HPF (ref 0–2)
Specific Gravity, Urine: 1.005 (ref 1.001–1.03)
Squamous Epithelial / HPF: NONE SEEN /HPF (ref <=5)
pH: 7 (ref 5.0–8.0)

## 2019-11-07 ENCOUNTER — Encounter: Payer: Self-pay | Admitting: Internal Medicine

## 2019-11-09 ENCOUNTER — Other Ambulatory Visit: Payer: Self-pay

## 2019-11-09 ENCOUNTER — Other Ambulatory Visit (INDEPENDENT_AMBULATORY_CARE_PROVIDER_SITE_OTHER): Payer: Medicare Other

## 2019-11-09 DIAGNOSIS — E785 Hyperlipidemia, unspecified: Secondary | ICD-10-CM

## 2019-11-09 DIAGNOSIS — R634 Abnormal weight loss: Secondary | ICD-10-CM | POA: Diagnosis not present

## 2019-11-09 DIAGNOSIS — R1031 Right lower quadrant pain: Secondary | ICD-10-CM | POA: Diagnosis not present

## 2019-11-09 DIAGNOSIS — Z1329 Encounter for screening for other suspected endocrine disorder: Secondary | ICD-10-CM

## 2019-11-09 LAB — CBC WITH DIFFERENTIAL/PLATELET
Basophils Absolute: 0 10*3/uL (ref 0.0–0.1)
Basophils Relative: 0.4 % (ref 0.0–3.0)
Eosinophils Absolute: 0.2 10*3/uL (ref 0.0–0.7)
Eosinophils Relative: 3.2 % (ref 0.0–5.0)
HCT: 38.9 % (ref 36.0–46.0)
Hemoglobin: 13.3 g/dL (ref 12.0–15.0)
Lymphocytes Relative: 32.6 % (ref 12.0–46.0)
Lymphs Abs: 1.9 10*3/uL (ref 0.7–4.0)
MCHC: 34.2 g/dL (ref 30.0–36.0)
MCV: 95.8 fl (ref 78.0–100.0)
Monocytes Absolute: 0.4 10*3/uL (ref 0.1–1.0)
Monocytes Relative: 6.8 % (ref 3.0–12.0)
Neutro Abs: 3.4 10*3/uL (ref 1.4–7.7)
Neutrophils Relative %: 57 % (ref 43.0–77.0)
Platelets: 200 10*3/uL (ref 150.0–400.0)
RBC: 4.06 Mil/uL (ref 3.87–5.11)
RDW: 13.3 % (ref 11.5–15.5)
WBC: 5.9 10*3/uL (ref 4.0–10.5)

## 2019-11-09 LAB — LIPID PANEL
Cholesterol: 197 mg/dL (ref 0–200)
HDL: 62.1 mg/dL (ref >39.00)
LDL Cholesterol: 114 mg/dL — ABNORMAL HIGH (ref 0–99)
NonHDL: 134.56
Total CHOL/HDL Ratio: 3
Triglycerides: 104 mg/dL (ref 0.0–149.0)
VLDL: 20.8 mg/dL (ref 0.0–40.0)

## 2019-11-09 LAB — COMPREHENSIVE METABOLIC PANEL
ALT: 14 U/L (ref 0–35)
AST: 15 U/L (ref 0–37)
Albumin: 4.2 g/dL (ref 3.5–5.2)
Alkaline Phosphatase: 71 U/L (ref 39–117)
BUN: 13 mg/dL (ref 6–23)
CO2: 26 mEq/L (ref 19–32)
Calcium: 9.6 mg/dL (ref 8.4–10.5)
Chloride: 107 mEq/L (ref 96–112)
Creatinine, Ser: 0.76 mg/dL (ref 0.40–1.20)
GFR: 74.07 mL/min (ref >60.00)
Glucose, Bld: 94 mg/dL (ref 70–99)
Potassium: 4 mEq/L (ref 3.5–5.1)
Sodium: 143 mEq/L (ref 135–145)
Total Bilirubin: 0.5 mg/dL (ref 0.2–1.2)
Total Protein: 6.5 g/dL (ref 6.0–8.3)

## 2019-11-09 LAB — TSH: TSH: 1.62 u[IU]/mL (ref 0.35–4.50)

## 2019-11-10 ENCOUNTER — Encounter: Payer: Self-pay | Admitting: *Deleted

## 2019-11-11 ENCOUNTER — Encounter: Payer: Self-pay | Admitting: Internal Medicine

## 2019-11-23 ENCOUNTER — Ambulatory Visit
Admission: RE | Admit: 2019-11-23 | Discharge: 2019-11-23 | Disposition: A | Discharge status: Home / Self Care | Payer: Medicare Other | Source: Ambulatory Visit | Attending: Internal Medicine | Admitting: Internal Medicine

## 2019-11-23 ENCOUNTER — Other Ambulatory Visit: Payer: Self-pay

## 2019-11-23 DIAGNOSIS — R1031 Right lower quadrant pain: Secondary | ICD-10-CM | POA: Insufficient documentation

## 2019-11-23 DIAGNOSIS — R634 Abnormal weight loss: Secondary | ICD-10-CM | POA: Diagnosis present

## 2019-11-23 DIAGNOSIS — R102 Pelvic and perineal pain: Secondary | ICD-10-CM | POA: Diagnosis present

## 2019-11-24 ENCOUNTER — Telehealth: Payer: Self-pay | Admitting: Internal Medicine

## 2019-11-24 ENCOUNTER — Encounter: Payer: Self-pay | Admitting: Internal Medicine

## 2019-11-24 NOTE — Telephone Encounter (Signed)
-----   Message from Bevelyn Buckles, MD sent at 11/24/2019  7:55 AM EDT ----- +constipation on right side of intestine  +bladder prolapse, vaginal and rectum and bladder full with urine  -is still having trouble with urination?  The stomach appears dropped as well downward which can cause digestive sx's and constipation   I would consider seeing GI and urogynecology  Is she agreeable?

## 2019-11-24 NOTE — Telephone Encounter (Signed)
ROI 11/01/2018 can you request copy of colonoscopy again need one for chart cant find scanned in copy

## 2019-11-26 ENCOUNTER — Encounter: Payer: Medicare Other | Admitting: Internal Medicine

## 2019-11-26 DIAGNOSIS — J329 Chronic sinusitis, unspecified: Secondary | ICD-10-CM | POA: Diagnosis not present

## 2019-12-03 NOTE — Telephone Encounter (Signed)
Re-printed and request faxed

## 2019-12-06 ENCOUNTER — Encounter: Payer: Self-pay | Admitting: Internal Medicine

## 2019-12-16 ENCOUNTER — Other Ambulatory Visit: Payer: Self-pay | Admitting: Internal Medicine

## 2019-12-16 DIAGNOSIS — J329 Chronic sinusitis, unspecified: Secondary | ICD-10-CM

## 2019-12-16 MED ORDER — AZITHROMYCIN 250 MG PO TABS: ORAL_TABLET | ORAL | 0 refills | Status: DC

## 2019-12-16 NOTE — Telephone Encounter (Signed)
I can take Zyrtec and saline solution    Beverly Lowe, Beverly Lowe "Beverly Lowe"  You 52 minutes ago (11:27 AM)  ML No yeast infections with Z pack   Whisnant, Beverly Lowe "Beverly Lowe"  You 53 minutes ago (11:27 AM)  ML Yes I agree to consult fee.   You  Kugler, Beverly Lowe "Beverly Lowe" 54 minutes ago (11:25 AM)  TM I am seeing patients and will get to you when I can but my patients in the office take priority  I had to ask you if you agreed to consult fee and my charts there are delays an appt is best Will get to you as soon as I can multitasking multiple things Do you get yeast infections with antibiotics  Typically Zpack or Augmentin which would you like  Also nasal saline, flonase, and over the counter allergy pill I.e zyrtec, xyzal, allegra, or claritin at night    [place], Beverly Lowe "Beverly Lowe"  You 56 minutes ago (11:23 AM)  ML I just call the office to try and talk to someone I've been holding for over 10 minutes , I know you're busy helping other patients but I've been trying to get a prescription sent to my pharmacy since yesterday for my sinus infection! Thank you    Deliah Boston, Beverly Lowe "Beverly Lowe"  You 2 hours ago (9:36 AM)  ML Dr [name] I hope you can help me today I've had this infection for 4-5 days with no sign of going away the nose mucous is a thick greenish/yellow and my face hurts. Thank you    [place]ial, Beverly Lowe "Beverly Lowe"  You 3 hours ago (8:34 AM)  ML Yes I will assume my options are better in [place]?  I think I've done a phone consult before so yes, I woke up not feeling well mucous yellow and I'm sneezing allot. Thank you, Beverly Lowe   You  Tursi, Beverly Lowe "Beverly Lowe" 4 hours ago (7:48 AM)  TM Any new issue outside of office visit we charge a consult fee for my chart I.e the sinus infection treatment  Are you agreeable to this?  Happy to help with GI consult do you want to stay in [place] or go to [place] where a lot of our patients go?     Tilford Pillar, CMA  You Yesterday (8:24 AM)     Please advise    Routing comment   Deliah Boston, Beverly Lowe "Beverly Lowe"  You Yesterday (7:23 AM)  ML Dr.Ciela Mahajan.I will consider seeing a GI doctor to rule out any other issues.  I seem to have a sinus infection, no sore throat or fever and I'll be traveling for thanksgiving, is there any way to get antibiotics so this sinus infection can be cleared up. Thank you    You  Deliah Boston, Beverly Lowe "Beverly Lowe" 2 weeks ago  TM Of course please let me know pain can come from full bladder or constipation    Tilford Pillar, CMA routed conversation to You 2 weeks ago  Deliah Boston, Beverly Lowe "Beverly Lowe"  You 2 weeks ago  ML Dr. [name]. referring to the addendum, thank you for having it read and reviewed again. My guess is that the heat I'm feeling must be the urine collecting on the right pelvis side. I don't have any issue of having trouble urinating or any leaking except for that hotness. I do have a bad habit of holding my urine due to laziness to go when I have the urge. The constipation on the right side is probably what I'm feeling if I don't pass that  stool and I get allot of gas throughout the day. I never associated the gas pains with that. I'll have to pay more attention to my body from now on. I'm not sure if I want to see a GI or urologist but I will keep that in mind. Thank you so much for taking so much time with me., Beverly Lowe    A/p sinusitis  Zyrtec, ns, flonase, zpack  Agreeable to fee  Time 5 min Dr. [name] [name]

## 2019-12-21 ENCOUNTER — Telehealth: Payer: Medicare Other | Admitting: Internal Medicine

## 2019-12-21 DIAGNOSIS — J321 Chronic frontal sinusitis: Secondary | ICD-10-CM

## 2019-12-21 NOTE — Addendum Note (Signed)
Addended by: Quentin Ore on: 12/21/2019 08:46 PM   Modules accepted: Orders

## 2019-12-21 NOTE — Telephone Encounter (Signed)
My chart message and charge 11.10.21  Thank you! Me to [name] "[name]"   TM  12:21 PM Sent zpack take care    Last read by [name] at 12:21 PM on 12/16/2019. Me   TM  12:21 PM Note I can take Zyrtec and saline solution       Kalp, Quenesha "[name]"  You 52 minutes ago (11:27 AM)   ML No yeast infections with Z pack      Arkin, Birgitta "[name]"  You 53 minutes ago (11:27 AM)   ML Yes I agree to consult fee.      You  Frandsen, Riti "[name]" 54 minutes ago (11:25 AM)   TM I am seeing patients and will get to you when I can but my patients in the office take priority  I had to ask you if you agreed to consult fee and my charts there are delays an appt is best Will get to you as soon as I can multitasking multiple things Do you get yeast infections with antibiotics  Typically Zpack or Augmentin which would you like  Also nasal saline, flonase, and over the counter allergy pill I.e zyrtec, xyzal, allegra, or claritin at night       Seavey, Loralee "[name]"  You 56 minutes ago (11:23 AM)   ML I just call the office to try and talk to someone I've been holding for over 10 minutes , I know you're busy helping other patients but I've been trying to get a prescription sent to my pharmacy since yesterday for my sinus infection! Thank you       Deliah Boston, Tokiko "[name]"  You 2 hours ago (9:36 AM)   ML Dr [name] I hope you can help me today I've had this infection for 4-5 days with no sign of going away the nose mucous is a thick greenish/yellow and my face hurts. Thank you       Girton, Maciah "[name]"  You 3 hours ago (8:34 AM)   ML Yes I will assume my options are better in [place]?  I think I've done a phone consult before so yes, I woke up not feeling well mucous yellow and I'm sneezing allot. Thank you, [name]      You  Dioguardi, Quinetta "[name]" 4 hours ago (7:48 AM)   TM Any new issue outside of office visit we charge a  consult fee for my chart I.e the sinus infection treatment  Are you agreeable to this?  Happy to help with GI consult do you want to stay in [place] or go to [place] where a lot of our patients go?        Tilford Pillar, CMA  You Yesterday (8:24 AM)       Please advise    Routing comment   Deliah Boston, Lilu "[name]"  You Yesterday (7:23 AM)   ML Dr.Milany Geck.I will consider seeing a GI doctor to rule out any other issues.  I seem to have a sinus infection, no sore throat or fever and I'll be traveling for thanksgiving, is there any way to get antibiotics so this sinus infection can be cleared up. Thank you       You  Deliah Boston, Dawna "[name]" 2 weeks ago   TM Of course please let me know pain can come from full bladder or constipation       Tilford Pillar, CMA routed conversation to You 2 weeks ago   Deliah Boston, Allora "[name]"  You 2 weeks  ago   ML Dr. [name]. referring to the addendum, thank you for having it read and reviewed again. My guess is that the heat I'm feeling must be the urine collecting on the right pelvis side. I don't have any issue of having trouble urinating or any leaking except for that hotness. I do have a bad habit of holding my urine due to laziness to go when I have the urge. The constipation on the right side is probably what I'm feeling if I don't pass that stool and I get allot of gas throughout the day. I never associated the gas pains with that. I'll have to pay more attention to my body from now on. I'm not sure if I want to see a GI or urologist but I will keep that in mind. Thank you so much for taking so much time with me., [name]    A/p sinusitis  Zyrtec, ns, flonase, zpack  Agreeable to fee  Time 5 min Dr. [name] [name]         11:53 AM Tilford Pillar, CMA routed this conversation to Me  [name] "[name]" to Me   ML  11:28 AM I can take Zyrtec and saline solution  Crago, Manpreet "[name]" to Me    ML  11:27 AM No yeast infections with Z pack Danser, Alexza "[name]" to Me   ML  11:27 AM Yes I agree to consult fee. Me to [name] "[name]"   TM  11:25 AM I am seeing patients and will get to you when I can but my patients in the office take priority  I had to ask you if you agreed to consult fee and my charts there are delays an appt is best Will get to you as soon as I can multitasking multiple things Do you get yeast infections with antibiotics  Typically Zpack or Augmentin which would you like  Also nasal saline, flonase, and over the counter allergy pill I.e zyrtec, xyzal, allegra, or claritin at night   Last read by [name] at 12:21 PM on 12/16/2019. Enzor, Hillery "[name]" to Me   ML  11:23 AM I just call the office to try and talk to someone I've been holding for over 10 minutes , I know you're busy helping other patients but I've been trying to get a prescription sent to my pharmacy since yesterday for my sinus infection! Thank you  [name] "[name]" to Me   ML  9:36 AM Dr [name] I hope you can help me today I've had this infection for 4-5 days with no sign of going away the nose mucous is a thick greenish/yellow and my face hurts. Thank you  Formby, Lisseth "[name]" to Me   ML  8:34 AM Yes I will assume my options are better in [place]?  I think I've done a phone consult before so yes, I woke up not feeling well mucous yellow and I'm sneezing allot. Thank you, [name] Me to [name] "[name]"   TM  7:48 AM Any new issue outside of office visit we charge a consult fee for my chart I.e the sinus infection treatment  Are you agreeable to this?  Happy to help with GI consult do you want to stay in [place] or go to [place] where a lot of our patients go?    Last read by [name] at 11:26 AM on 12/16/2019. December 15, 2019 Tilford Pillar, CMA to Me     8:24 AM Please  advise   Skeens,  Jaziah "[name]" to Me   ML  7:23 AM Dr.Maleah Rabago.I will consider seeing a GI doctor to rule out any other issues.  I seem to have a sinus infection, no sore throat or fever and I'll be traveling for thanksgiving, is there any way to get antibiotics so this sinus infection can be cleared up. Thank you  November 26, 2019 Me to [name] "[name]"   TM  4:54 PM Of course please let me know pain can come from full bladder or constipation   Last read by [name] at 11:21 AM on 12/16/2019.     4:28 PM Tilford Pillar, CMA routed this conversation to Me  [name] "[name]" to Me   ML  2:28 PM Dr. [name]. referring to the addendum, thank you for having it read and reviewed again. My guess is that the heat I'm feeling must be the urine collecting on the right pelvis side. I don't have any issue of having trouble urinating or any leaking except for that hotness. I do have a bad habit of holding my urine due to laziness to go when I have the urge. The constipation on the right side is probably what I'm feeling if I don't pass that stool and I get allot of gas throughout the day. I never associated the gas pains with that. I'll have to pay more attention to my body from now on. I'm not sure if I want to see a GI or urologist but I will keep that in mind. Thank you so much for taking so much time with me., [name]   A/p  Sinusitis  Zpack NS, Flonase  Prn allergy otc med  Pt agreeable to fee   Dr. [name] [name] Time spent 5 min my chart

## 2020-01-07 ENCOUNTER — Encounter: Payer: Self-pay | Admitting: Internal Medicine

## 2020-02-03 ENCOUNTER — Encounter: Payer: Self-pay | Admitting: Gastroenterology

## 2020-02-09 ENCOUNTER — Ambulatory Visit
Admission: RE | Admit: 2020-02-09 | Discharge: 2020-02-09 | Disposition: A | Discharge status: Home / Self Care | Payer: Medicare Other | Source: Ambulatory Visit | Attending: Internal Medicine | Admitting: Internal Medicine

## 2020-02-09 ENCOUNTER — Other Ambulatory Visit: Payer: Self-pay

## 2020-02-09 ENCOUNTER — Ambulatory Visit: Payer: Medicare Other | Admitting: Internal Medicine

## 2020-02-09 DIAGNOSIS — Z1231 Encounter for screening mammogram for malignant neoplasm of breast: Secondary | ICD-10-CM | POA: Diagnosis not present

## 2020-02-15 ENCOUNTER — Encounter: Payer: Self-pay | Admitting: Internal Medicine

## 2020-02-19 ENCOUNTER — Other Ambulatory Visit: Payer: Self-pay | Admitting: Internal Medicine

## 2020-02-19 DIAGNOSIS — F419 Anxiety disorder, unspecified: Secondary | ICD-10-CM

## 2020-02-19 MED ORDER — ALPRAZOLAM 0.25 MG PO TABS: 0.1250 mg | ORAL_TABLET | Freq: Every day | ORAL | 5 refills | Status: DC | PRN

## 2020-02-24 ENCOUNTER — Other Ambulatory Visit: Payer: Self-pay

## 2020-02-24 ENCOUNTER — Encounter: Payer: Self-pay | Admitting: Gastroenterology

## 2020-02-24 ENCOUNTER — Ambulatory Visit (INDEPENDENT_AMBULATORY_CARE_PROVIDER_SITE_OTHER): Payer: Medicare Other | Admitting: Gastroenterology

## 2020-02-24 ENCOUNTER — Other Ambulatory Visit: Payer: Medicare Other

## 2020-02-24 VITALS — BP 120/68 | HR 80 | Ht 59.5 in | Wt 109.0 lb

## 2020-02-24 DIAGNOSIS — K5909 Other constipation: Secondary | ICD-10-CM | POA: Diagnosis not present

## 2020-02-24 DIAGNOSIS — R140 Abdominal distension (gaseous): Secondary | ICD-10-CM | POA: Insufficient documentation

## 2020-02-24 DIAGNOSIS — Z800 Family history of malignant neoplasm of digestive organs: Secondary | ICD-10-CM | POA: Diagnosis not present

## 2020-02-24 NOTE — Patient Instructions (Signed)
If you are age 76 or older, your body mass index should be between 23-30. Your Body mass index is 21.65 kg/m. If this is out of the aforementioned range listed, please consider follow up with your Primary Care Provider.  If you are age 40 or younger, your body mass index should be between 19-25. Your Body mass index is 21.65 kg/m. If this is out of the aformentioned range listed, please consider follow up with your Primary Care Provider.   Your provider has requested that you go to the basement level for lab work before leaving today. Press "B" on the elevator. The lab is located at the first door on the left as you exit the elevator.  Start Miralax daily.  Due to recent changes in healthcare laws, you may see the results of your imaging and laboratory studies on MyChart before your provider has had a chance to review them.  We understand that in some cases there may be results that are confusing or concerning to you. Not all laboratory results come back in the same time frame and the provider may be waiting for multiple results in order to interpret others.  Please give [place] 48 hours in order for your provider to thoroughly review all the results before contacting the office for clarification of your results.    Thank you for choosing me and [facility].  Doug Sou, PA-C

## 2020-02-24 NOTE — Progress Notes (Signed)
02/24/2020 Beverly Lowe [id number] 21-Jul-1945   HISTORY OF PRESENT ILLNESS: This is a 75 year old female who is new to our office.  She has been referred here by her PCP, Dr. Judie Grieve, for evaluation regarding right lower quadrant abdominal pain and constipation.  She tells me that she has had constipation forever.  Currently she is only using MiraLAX intermittently as needed.  She says that she does move her bowels to some degree every day, but does have frequent straining.  She knows that she has hemorrhoids as seen on her colonoscopy and gets occasional bright red blood from thsse, but not often.  She gets some abdominal discomfort and gas with eating certain foods.  She knows that she is lactose intolerant and feels that she may have some gluten sensitivity as well.  She had a CT scan of the pelvis recently that showed retained stool in the right colon and transverse colon.  It also showed pelvic floor laxity with partial prolapse of the bladder, vagina, and rectum.  CBC, CMP, TSH all within normal limits recently.  [place] colon[place] in [place] before she moved to this area in September 2019 at which time she was found to have 2 polyps that were removed that were only focal reactive lymphoid aggregates on pathology.  She also had few diverticula in the left colon, moderate size internal hemorrhoids, tortuous colon.  She does have a family history of colon cancer in her father and brother.   Past Medical History:  Diagnosis Date  . Anxiety    did not like the way ssri made her feel ? name  . Colon polyps   . Hemorrhoids   . Hemorrhoids   . Herpes    oral  . Shingles    right back/flank remotely lasting 9 months of pain was on gabapentin  . Tinnitus    Past Surgical History:  Procedure Laterality Date  . COLONOSCOPY    . dental implants      reports that she has never smoked. She has never used smokeless tobacco. She reports current alcohol use. She reports that  she does not use drugs. family history includes Atrial fibrillation in her father and nephew; Breast cancer in her maternal aunt and mother; Cancer in her brother, father, and mother; Colon cancer in her brother and father; Diabetes in her father; Heart disease in her father; Osteoporosis in her maternal grandmother and mother; Uterine cancer in her mother. No Known Allergies    Outpatient Encounter Medications as of 02/24/2020  Medication Sig  . ALPRAZolam (XANAX) 0.25 MG tablet Take 0.5 tablets (0.125 mg total) by mouth daily as needed for anxiety.  . hydrocortisone (PROCTOZONE-HC) 2.5 % rectal cream Place 1 application rectally 2 (two) times daily. Prn  . polyethylene glycol (MIRALAX / GLYCOLAX) 17 g packet Take 17 g by mouth as needed.  . valACYclovir (VALTREX) 1000 MG tablet Take 2 tablets (2,000 mg total) by mouth 2 (two) times daily as needed. X 1 day outbreak prn  . [DISCONTINUED] azithromycin (ZITHROMAX) 250 MG tablet 2 pills day 1 and 1 pill day 2-5 with food (Patient not taking: Reported on 02/24/2020)   No facility-administered encounter medications on file as of 02/24/2020.    REVIEW OF SYSTEMS  : All other systems reviewed and negative except where noted in the History of Present Illness.   PHYSICAL EXAM: BP 120/68 (BP Location: Left Arm, Patient Position: Sitting, Cuff Size: Normal)   Pulse 80   Ht 4'  11.5" (1.511 m)   Wt 109 lb (49.4 kg)   BMI 21.65 kg/m  General: Well developed white female in no acute distress Head: Normocephalic and atraumatic Eyes:  Sclerae anicteric, conjunctiva pink. Ears: Normal auditory acuity Lungs: Clear throughout to auscultation; no W/R/R. Heart: Regular rate and rhythm; no M/R/G. Abdomen: Soft, non-distended.  BS present.  Non-tender. Musculoskeletal: Symmetrical with no gross deformities  Skin: No lesions on visible extremities Extremities: No edema  Neurological: Alert oriented x 4, grossly non-focal Psychological:  Alert and  cooperative. Normal mood and affect  ASSESSMENT AND PLAN: *Chronic constipation with some right lower quadrant abdominal discomfort/fullness and bloating: CT scan of the pelvis showed retained stool in the right colon and transverse colon.  Right now she is only using MiraLAX intermittently.  I encouraged her to use a full dose daily and reduce to half dose daily if she feels like that is too much.  We will check celiac labs at her request. *Family history of colon cancer in her father and brother: Her last colonoscopy was September 2019.  We will tentatively put her in for colonoscopy recall for September 2024.   CC:  [name], [name] *

## 2020-02-25 LAB — TISSUE TRANSGLUTAMINASE, IGA: (tTG) Ab, IgA: <1 U/mL

## 2020-02-25 LAB — IGA: Immunoglobulin A: 102 mg/dL (ref 70–320)

## 2020-03-02 ENCOUNTER — Encounter: Payer: Self-pay | Admitting: Internal Medicine

## 2020-03-02 ENCOUNTER — Ambulatory Visit (INDEPENDENT_AMBULATORY_CARE_PROVIDER_SITE_OTHER): Payer: Medicare Other

## 2020-03-02 ENCOUNTER — Other Ambulatory Visit: Payer: Self-pay

## 2020-03-02 ENCOUNTER — Ambulatory Visit (INDEPENDENT_AMBULATORY_CARE_PROVIDER_SITE_OTHER): Payer: Medicare Other | Admitting: Internal Medicine

## 2020-03-02 VITALS — BP 130/74 | HR 57 | Temp 97.5°F | Ht 59.5 in | Wt 109.2 lb

## 2020-03-02 DIAGNOSIS — N3000 Acute cystitis without hematuria: Secondary | ICD-10-CM

## 2020-03-02 DIAGNOSIS — R55 Syncope and collapse: Secondary | ICD-10-CM

## 2020-03-02 DIAGNOSIS — I959 Hypotension, unspecified: Secondary | ICD-10-CM

## 2020-03-02 LAB — BASIC METABOLIC PANEL
BUN: 12 mg/dL (ref 6–23)
CO2: 30 mEq/L (ref 19–32)
Calcium: 9.9 mg/dL (ref 8.4–10.5)
Chloride: 104 mEq/L (ref 96–112)
Creatinine, Ser: 0.67 mg/dL (ref 0.40–1.20)
GFR: 85.2 mL/min (ref >60.00)
Glucose, Bld: 88 mg/dL (ref 70–99)
Potassium: 3.9 mEq/L (ref 3.5–5.1)
Sodium: 141 mEq/L (ref 135–145)

## 2020-03-02 LAB — CBC WITH DIFFERENTIAL/PLATELET
Basophils Absolute: 0 10*3/uL (ref 0.0–0.1)
Basophils Relative: 0.7 % (ref 0.0–3.0)
Eosinophils Absolute: 0.1 10*3/uL (ref 0.0–0.7)
Eosinophils Relative: 1.7 % (ref 0.0–5.0)
HCT: 38.2 % (ref 36.0–46.0)
Hemoglobin: 13 g/dL (ref 12.0–15.0)
Lymphocytes Relative: 35.5 % (ref 12.0–46.0)
Lymphs Abs: 2 10*3/uL (ref 0.7–4.0)
MCHC: 34 g/dL (ref 30.0–36.0)
MCV: 94.9 fl (ref 78.0–100.0)
Monocytes Absolute: 0.4 10*3/uL (ref 0.1–1.0)
Monocytes Relative: 6.4 % (ref 3.0–12.0)
Neutro Abs: 3.2 10*3/uL (ref 1.4–7.7)
Neutrophils Relative %: 55.7 % (ref 43.0–77.0)
Platelets: 186 10*3/uL (ref 150.0–400.0)
RBC: 4.03 Mil/uL (ref 3.87–5.11)
RDW: 12.7 % (ref 11.5–15.5)
WBC: 5.6 10*3/uL (ref 4.0–10.5)

## 2020-03-02 LAB — CORTISOL: Cortisol, Plasma: 7.2 ug/dL

## 2020-03-02 NOTE — Progress Notes (Signed)
Patient had a fainting episode last Tuesday. States she had ate breakfast and lunch that day. Had not drank much water. After having a facial in the afternoon Patient went home. Drank water, ate a snack, then went to shower.  Patient started feeling nauseous and weak. Was able to lower herself down and did not fall before passing out. States her husband checked her BP and it was 45/60.   Patient has never fainted before, no history of low BP. Patient has history of diarrhea but had not had any that day or the day before. Patient had not skipped any meals or ate anything new. No changes in medication, no know injuries.   No faint like symptoms since this episode.

## 2020-03-02 NOTE — Patient Instructions (Addendum)
Hypotension-stay 55 to 64 ounces daily   Syncope  Syncope refers to a condition in which a person temporarily loses consciousness. Syncope may also be called fainting or passing out. It is caused by a sudden decrease in blood flow to the brain. Even though most causes of syncope are not dangerous, syncope can be a sign of a serious medical problem. Your health care provider may do tests to find the reason why you are having syncope. Signs that you may be about to faint include:  Feeling dizzy or light-headed.  Feeling nauseous.  Seeing all white or all black in your field of vision.  Having cold, clammy skin. If you faint, get medical help right away. Call your local emergency services (911 in the [place] Do not drive yourself to the hospital. Follow these instructions at home: Pay attention to any changes in your symptoms. Take these actions to stay safe and to help relieve your symptoms: Lifestyle  Do not drive, use machinery, or play sports until your health care provider says it is okay.  Do not drink alcohol.  Do not use any products that contain nicotine or tobacco, such as cigarettes and e-cigarettes. If you need help quitting, ask your health care provider.  Drink enough fluid to keep your urine pale yellow. General instructions  Take over-the-counter and prescription medicines only as told by your health care provider.  If you are taking blood pressure or heart medicine, get up slowly and take several minutes to sit and then stand. This can reduce dizziness or light-headedness.  Have someone stay with you until you feel stable.  If you start to feel like you might faint, lie down right away and raise (elevate) your feet above the level of your heart. Breathe deeply and steadily. Wait until all the symptoms have passed.  Keep all follow-up visits as told by your health care provider. This is important. Get help right away if you:  Have a severe headache.  Faint once or  repeatedly.  Have pain in your chest, abdomen, or back.  Have a very fast or irregular heartbeat (palpitations).  Have pain when you breathe.  Are bleeding from your mouth or rectum, or you have black or tarry stool.  Have a seizure.  Are confused.  Have trouble walking.  Have severe weakness.  Have vision problems. These symptoms may represent a serious problem that is an emergency. Do not wait to see if your symptoms will go away. Get medical help right away. Call your local emergency services (911 in the [place] Do not drive yourself to the hospital. Summary  Syncope refers to a condition in which a person temporarily loses consciousness. It is caused by a sudden decrease in blood flow to the brain.  Signs that you may be about to faint include dizziness, feeling light-headed, feeling nauseous, sudden vision changes, or cold, clammy skin.  Although most causes of syncope are not dangerous, syncope can be a sign of a serious medical problem. If you faint, get medical help right away. This information is not intended to replace advice given to you by your health care provider. Make sure you discuss any questions you have with your health care provider. Document Revised: 06/04/2019 Document Reviewed: 06/04/2019 Elsevier Patient Education  2021 Elsevier Inc.  As your heart beats, it forces blood through your body. Hypotension, commonly called low blood pressure, is when the force of blood pumping through your arteries is too weak. Arteries are blood vessels that carry blood  from the heart throughout the body. Depending on the cause and severity, hypotension may be harmless (benign) or may cause serious problems (be critical). When blood pressure is too low, you may not get enough blood to your brain or to the rest of your organs. This can cause weakness, light-headedness, rapid heartbeat, and fainting. What are the causes? This condition may be caused by:  Blood loss.  Loss of  body fluids (dehydration).  Heart problems.  Hormone (endocrine) problems.  Pregnancy.  Severe infection.  Lack of certain nutrients.  Severe allergic reactions (anaphylaxis).  Certain medicines, such as blood pressure medicine or medicines that make the body lose excess fluids (diuretics). Sometimes, hypotension may be caused by not taking medicine as directed, such as taking too much of a certain medicine. What increases the risk? The following factors may make you more likely to develop this condition:  Age. Risk increases as you get older.  Conditions that affect the heart or the central nervous system.  Taking certain medicines, such as blood pressure medicine or diuretics.  Being pregnant. What are the signs or symptoms? Common symptoms of this condition include:  Weakness.  Light-headedness.  Dizziness.  Blurred vision.  Fatigue.  Rapid heartbeat.  Fainting, in severe cases. How is this diagnosed? This condition is diagnosed based on:  Your medical history.  Your symptoms.  Your blood pressure measurement. Your health care provider will check your blood pressure when you are: ? Lying down. ? Sitting. ? Standing. A blood pressure reading is recorded as two numbers, such as "120 over 80" (or 120/80). The first ("top") number is called the systolic pressure. It is a measure of the pressure in your arteries as your heart beats. The second ("bottom") number is called the diastolic pressure. It is a measure of the pressure in your arteries when your heart relaxes between beats. Blood pressure is measured in a unit called mm Hg. Healthy blood pressure for most adults is 120/80. If your blood pressure is below 90/60, you may be diagnosed with hypotension. Other information or tests that may be used to diagnose hypotension include:  Your other vital signs, such as your heart rate and temperature.  Blood tests.  Tilt table test. For this test, you will be  safely secured to a table that moves you from a lying position to an upright position. Your heart rhythm and blood pressure will be monitored during the test. How is this treated? Treatment for this condition may include:  Changing your diet. This may involve eating more salt (sodium) or drinking more water.  Taking medicines to raise your blood pressure.  Changing the dosage of certain medicines you are taking that might be lowering your blood pressure.  Wearing compression stockings. These stockings help to prevent blood clots and reduce swelling in your legs. In some cases, you may need to go to the hospital for:  Fluid replacement. This means you will receive fluids through an IV.  Blood replacement. This means you will receive donated blood through an IV (transfusion).  Treating an infection or heart problems, if this applies.  Monitoring. You may need to be monitored while medicines that you are taking wear off. Follow these instructions at home: Eating and drinking  Drink enough fluid to keep your urine pale yellow.  Eat a healthy diet, and follow instructions from your health care provider about eating or drinking restrictions. A healthy diet includes: ? Fresh fruits and vegetables. ? Whole grains. ? Lean meats. ?  Low-fat dairy products.  Eat extra salt only as directed. Do not add extra salt to your diet unless your health care provider told you to do that.  Eat frequent, small meals.  Avoid standing up suddenly after eating.   Medicines  Take over-the-counter and prescription medicines only as told by your health care provider. ? Follow instructions from your health care provider about changing the dosage of your current medicines, if this applies. ? Do not stop or adjust any of your medicines on your own. General instructions  Wear compression stockings as told by your health care provider.  Get up slowly from lying down or sitting positions. This gives your  blood pressure a chance to adjust.  Avoid hot showers and excessive heat as directed by your health care provider.  Return to your normal activities as told by your health care provider. Ask your health care provider what activities are safe for you.  Do not use any products that contain nicotine or tobacco, such as cigarettes, e-cigarettes, and chewing tobacco. If you need help quitting, ask your health care provider.  Keep all follow-up visits as told by your health care provider. This is important.   Contact a health care provider if you:  Vomit.  Have diarrhea.  Have a fever for more than 2-3 days.  Feel more thirsty than usual.  Feel weak and tired. Get help right away if you:  Have chest pain.  Have a fast or irregular heartbeat.  Develop numbness in any part of your body.  Cannot move your arms or your legs.  Have trouble speaking.  Become sweaty or feel light-headed.  Faint.  Feel short of breath.  Have trouble staying awake.  Feel confused. Summary  Hypotension is when the force of blood pumping through your arteries is too weak.  Hypotension may be harmless (benign) or may cause serious problems (be critical).  Treatment for this condition may include changing your diet, changing your medicines, and wearing compression stockings.  In some cases, you may need to go to the hospital for fluid or blood replacement. This information is not intended to replace advice given to you by your health care provider. Make sure you discuss any questions you have with your health care provider. Document Revised: 07/18/2017 Document Reviewed: 07/18/2017 Elsevier Patient Education  2021 [employer].

## 2020-03-02 NOTE — Addendum Note (Signed)
Addended by: Warden Fillers on: 03/02/2020 11:59 AM   Modules accepted: Orders

## 2020-03-02 NOTE — Progress Notes (Addendum)
Chief Complaint  Patient presents with  . Follow-up  . Loss of Consciousness   F/u  1. Syncope 02/23/20 when BP dropped 45/60 after facial and had nausea BP normally 120/65-70 and not sure if had enough water she saw stars before fainting  This is new never happened before did not call EMS  Review of Systems  Constitutional: Negative for weight loss.  HENT: Negative for hearing loss.   Eyes: Negative for blurred vision.  Respiratory: Negative for shortness of breath.   Cardiovascular: Negative for chest pain.  Skin: Negative for rash.  Neurological: Positive for loss of consciousness.   Past Medical History:  Diagnosis Date  . Anxiety    did not like the way ssri made her feel ? name  . Colon polyps   . Hemorrhoids   . Hemorrhoids   . Herpes    oral  . Shingles    right back/flank remotely lasting 9 months of pain was on gabapentin  . Tinnitus    Past Surgical History:  Procedure Laterality Date  . COLONOSCOPY    . dental implants     Family History  Problem Relation Age of Onset  . Cancer Mother        breast later in 38s and uterine  . Breast cancer Mother        late 40's  . Osteoporosis Mother   . Uterine cancer Mother   . Cancer Father        colon  . Colon cancer Father   . Diabetes Father   . Heart disease Father   . Atrial fibrillation Father   . Cancer Brother        colon  . Colon cancer Brother   . Breast cancer Maternal Aunt   . Osteoporosis Maternal Grandmother   . Atrial fibrillation Nephew    Social History   Socioeconomic History  . Marital status: Married    Spouse name: Not on file  . Number of children: Not on file  . Years of education: Not on file  . Highest education level: Not on file  Occupational History  . Not on file  Tobacco Use  . Smoking status: Never Smoker  . Smokeless tobacco: Never Used  Vaping Use  . Vaping Use: Never used  Substance and Sexual Activity  . Alcohol use: Yes    Comment: socially  . Drug use:  Never  . Sexual activity: Yes  Other Topics Concern  . Not on file  Social History Narrative   Moved from [facility] in 3 or 05/2018 to be near son and grandkids    Never smoker    Exercise yes      DPR Husband Clarinda Obi [phone]   Social Determinants of Health   Financial Resource Strain: Low Risk   . Difficulty of Paying Living Expenses: Not hard at all  Food Insecurity: No Food Insecurity  . Worried About Programme researcher, broadcasting/film/video in the Last Year: Never true  . Ran Out of Food in the Last Year: Never true  Transportation Needs: No Transportation Needs  . Lack of Transportation (Medical): No  . Lack of Transportation (Non-Medical): No  Physical Activity: Sufficiently Active  . Days of Exercise per Week: 5 days  . Minutes of Exercise per Session: 30 min  Stress: No Stress Concern Present  . Feeling of Stress : Not at all  Social Connections: Not on file  Intimate Partner Violence: Not At Risk  . Fear of  Current or Ex-Partner: No  . Emotionally Abused: No  . Physically Abused: No  . Sexually Abused: No   Current Meds  Medication Sig  . ALPRAZolam (XANAX) 0.25 MG tablet Take 0.5 tablets (0.125 mg total) by mouth daily as needed for anxiety.  . hydrocortisone (PROCTOZONE-HC) 2.5 % rectal cream Place 1 application rectally 2 (two) times daily. Prn  . polyethylene glycol (MIRALAX / GLYCOLAX) 17 g packet Take 17 g by mouth as needed.  . valACYclovir (VALTREX) 1000 MG tablet Take 2 tablets (2,000 mg total) by mouth 2 (two) times daily as needed. X 1 day outbreak prn   Allergies  Allergen Reactions  . Gluten Meal Other (See Comments)    Bloating and abdominal cramps   Recent Results (from the past 2160 hour(s))  IgA     Status: None   Collection Time: 02/24/20 12:25 PM  Result Value Ref Range   Immunoglobulin A 102 70 - 320 mg/dL  Tissue transglutaminase, IgA     Status: None   Collection Time: 02/24/20 12:25 PM  Result Value Ref Range   (tTG) Ab, IgA <1.0 U/mL     Comment: Value          Interpretation -----          -------------- <15.0          Antibody not detected > or = 15.0    Antibody detected .    Objective  Body mass index is 21.69 kg/m. Wt Readings from Last 3 Encounters:  03/02/20 109 lb 3.2 oz (49.5 kg)  02/24/20 109 lb (49.4 kg)  11/05/19 107 lb 12.8 oz (48.9 kg)   Temp Readings from Last 3 Encounters:  03/02/20 (!) 97.5 F (36.4 C) (Oral)  11/05/19 98 F (36.7 C) (Oral)  05/05/19 (!) 97.2 F (36.2 C) (Temporal)   BP Readings from Last 3 Encounters:  03/02/20 130/74  02/24/20 120/68  11/05/19 126/70   Pulse Readings from Last 3 Encounters:  03/02/20 (!) 57  02/24/20 80  11/05/19 67    Physical Exam Vitals and nursing note reviewed.  Constitutional:      Appearance: Normal appearance. She is well-developed and well-groomed.  HENT:     Head: Normocephalic and atraumatic.  Eyes:     Conjunctiva/sclera: Conjunctivae normal.     Pupils: Pupils are equal, round, and reactive to light.  Cardiovascular:     Rate and Rhythm: Normal rate and regular rhythm.     Heart sounds: Normal heart sounds. No murmur heard.   Pulmonary:     Effort: Pulmonary effort is normal.     Breath sounds: Normal breath sounds.  Skin:    General: Skin is warm and moist.  Neurological:     General: No focal deficit present.     Mental Status: She is alert and oriented to person, place, and time. Mental status is at baseline.     Gait: Gait normal.  Psychiatric:        Attention and Perception: Attention and perception normal.        Mood and Affect: Mood and affect normal.        Speech: Speech normal.        Behavior: Behavior normal. Behavior is cooperative.        Thought Content: Thought content normal.        Cognition and Memory: Cognition and memory normal.        Judgment: Judgment normal.     Assessment  Plan  Hypotension,  with syncope 02/23/20 - Plan: Urinalysis, Routine w reflex microscopic, Urine Culture, CBC with  Differential/Platelet, Cortisol, Basic Metabolic Panel (BMET), ECHOCARDIOGRAM COMPLETE, EKG 12-Lead today SB TW flattenning AVL, V2 and AVF, DG Chest 2 View Increase hydration with water  R/o UTI  Referred cards Dr. Okey Dupre   HM Flu shot utd Never had prevnar or pna 23 or shingrix disc'ed today consider rec Tdap been >10 yearsconsider  covid had 3/3  Pap out of age window  Mammogram10/2021 ordered had 11/2018, 02/09/20 neg DEXA10/2020 +osteoporosis T score -4.1 -declines prolia or meds to tx 11/05/19   Colonoscopy had in 2019 FH brother and dad colon cancer due in 5 years9/20/2024 h/o polyps personally mod IH, divert.tortuous colon,x2 polpys Dr. Elwyn Lade 10/25/17   Skin: currently no issues h/o cysts removal no skin cancer  Supplements zinc, calcium, vitamin D Provider: Dr. [name] [name]-Internal Medicine

## 2020-03-03 ENCOUNTER — Encounter: Payer: Self-pay | Admitting: Internal Medicine

## 2020-03-03 LAB — URINALYSIS, ROUTINE W REFLEX MICROSCOPIC
Bilirubin, UA: NEGATIVE
Glucose, UA: NEGATIVE
Ketones, UA: NEGATIVE
Leukocytes,UA: NEGATIVE
Nitrite, UA: NEGATIVE
Protein,UA: NEGATIVE
RBC, UA: NEGATIVE
Specific Gravity, UA: <=1.005 — AB (ref 1.005–1.030)
Urobilinogen, Ur: 0.2 mg/dL (ref 0.2–1.0)
pH, UA: 7 (ref 5.0–7.5)

## 2020-03-03 NOTE — Telephone Encounter (Signed)
-----   Message from Bevelyn Buckles, MD sent at 03/02/2020  5:04 PM EST ----- Lungs with chronic bronchitis changes  -did she previously smoke or have allergies ?  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

## 2020-03-04 LAB — URINE CULTURE

## 2020-03-07 ENCOUNTER — Encounter: Payer: Self-pay | Admitting: Cardiology

## 2020-03-07 ENCOUNTER — Ambulatory Visit (INDEPENDENT_AMBULATORY_CARE_PROVIDER_SITE_OTHER): Payer: Medicare Other | Admitting: Cardiology

## 2020-03-07 ENCOUNTER — Ambulatory Visit (INDEPENDENT_AMBULATORY_CARE_PROVIDER_SITE_OTHER): Payer: Medicare Other

## 2020-03-07 ENCOUNTER — Other Ambulatory Visit: Payer: Self-pay

## 2020-03-07 VITALS — BP 130/70 | HR 61 | Ht 60.0 in | Wt 110.2 lb

## 2020-03-07 DIAGNOSIS — R55 Syncope and collapse: Secondary | ICD-10-CM | POA: Diagnosis not present

## 2020-03-07 NOTE — Patient Instructions (Signed)
Medication Instructions:  Your physician recommends that you continue on your current medications as directed. Please refer to the Current Medication list given to you today.  *If you need a refill on your cardiac medications before your next appointment, please call your pharmacy*   Lab Work: None ordered If you have labs (blood work) drawn today and your tests are completely normal, you will receive your results only by: Marland Kitchen MyChart Message (if you have MyChart) OR . A paper copy in the mail If you have any lab test that is abnormal or we need to change your treatment, we will call you to review the results.   Testing/Procedures:  1.  Please keep your appointment as scheduled for your Echocardiogram on 03/17/20.  2.  Your physician has recommended that you wear a Zio monitor for 10 days. This monitor is a medical device that records the heart's electrical activity. Doctors most often use these monitors to diagnose arrhythmias. Arrhythmias are problems with the speed or rhythm of the heartbeat. The monitor is a small device applied to your chest. You can wear one while you do your normal daily activities. While wearing this monitor if you have any symptoms to push the button and record what you felt. Once you have worn this monitor for the period of time provider prescribed (Usually 14 days), you will return the monitor device in the postage paid box. Once it is returned they will download the data collected and provide [place] with a report which the provider will then review and we will call you with those results. Important tips:  1. Avoid showering during the first 24 hours of wearing the monitor. 2. Avoid excessive sweating to help maximize wear time. 3. Do not submerge the device, no hot tubs, and no swimming pools. 4. Keep any lotions or oils away from the patch. 5. After 24 hours you may shower with the patch on. Take brief showers with your back facing the shower head.  6. Do not remove  patch once it has been placed because that will interrupt data and decrease adhesive wear time. 7. Push the button when you have any symptoms and write down what you were feeling. 8. Once you have completed wearing your monitor, remove and place into box which has postage paid and place in your outgoing mailbox.  9. If for some reason you have misplaced your box then call our office and we can provide another box and/or mail it off for you.        Follow-Up: At [facility], you and your health needs are our priority.  As part of our continuing mission to provide you with exceptional heart care, we have created designated Provider Care Teams.  These Care Teams include your primary Cardiologist (physician) and Advanced Practice Providers (APPs -  Physician Assistants and Nurse Practitioners) who all work together to provide you with the care you need, when you need it.  We recommend signing up for the patient portal called "MyChart".  Sign up information is provided on this After Visit Summary.  MyChart is used to connect with patients for Virtual Visits (Telemedicine).  Patients are able to view lab/test results, encounter notes, upcoming appointments, etc.  Non-urgent messages can be sent to your provider as well.   To learn more about what you can do with MyChart, go to [email or web address].    Your next appointment:   4-5 week(s)  The format for your next appointment:   In Person  Provider:  Kate Sable, MD   Other Instructions

## 2020-03-07 NOTE — Progress Notes (Signed)
Cardiology Office Note:    Date:  03/07/2020   ID:  Beverly Lowe, DOB Apr 10, 1945, MRN [id number]  PCP:  Beverly Lowe, Beverly Spillers, MD  [facility] Cardiologist:  No primary care provider on file.  [facility] Electrophysiologist:  None   Referring MD: Beverly Lowe, Beverly Lowe *   Chief Complaint  Patient presents with  . Other    Hypotension and syncope pt would like to discuss abn EKG. Meds reviewed verbally with pt.    History of Present Illness:    Beverly Lowe is a 75 y.o. female with a hx of anxiety who presents due to abnormal ECG and hypotension.  Patient had an episode of syncope and passing out on 02/23/2020.  She was at home, just took a shower and was about to cook dinner when she suddenly felt dizzy, nauseous, blurry vision.  She told her husband who helped her to a seat.  He checked her blood pressures with systolics in the 60s.  She waited a couple of minutes and symptoms resolved.  She denies having any prior episodes, and has not had a similar episode since. She denies any history of heart disease, palpitations, chest pain, shortness of breath.  She eventually followed up with PCP where echo was ordered.  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  She otherwise feels well, and has no concerns at this time.  Past Medical History:  Diagnosis Date  . Anxiety    did not like the way ssri made her feel ? name  . Colon polyps   . Hemorrhoids   . Hemorrhoids   . Herpes    oral  . Shingles    right back/flank remotely lasting 9 months of pain was on gabapentin  . Tinnitus     Past Surgical History:  Procedure Laterality Date  . COLONOSCOPY    . dental implants      Current Medications: Current Meds  Medication Sig  . ALPRAZolam (XANAX) 0.25 MG tablet Take 0.5 tablets (0.125 mg total) by mouth daily as needed for anxiety.  . hydrocortisone (PROCTOZONE-HC) 2.5 % rectal cream Place 1 application rectally 2 (two) times daily. Prn   . polyethylene glycol (MIRALAX / GLYCOLAX) 17 g packet Take 17 g by mouth as needed.  . valACYclovir (VALTREX) 1000 MG tablet Take 2 tablets (2,000 mg total) by mouth 2 (two) times daily as needed. X 1 day outbreak prn     Allergies:   Gluten meal   Social History   Socioeconomic History  . Marital status: Married    Spouse name: Not on file  . Number of children: Not on file  . Years of education: Not on file  . Highest education level: Not on file  Occupational History  . Not on file  Tobacco Use  . Smoking status: Never Smoker  . Smokeless tobacco: Never Used  Vaping Use  . Vaping Use: Never used  Substance and Sexual Activity  . Alcohol use: Yes    Comment: socially  . Drug use: Never  . Sexual activity: Yes  Other Topics Concern  . Not on file  Social History Narrative   Moved from [facility] in 3 or 05/2018 to be near son and grandkids    Never smoker    Exercise yes      DPR Husband Beverly Lowe [phone]   Social Determinants of Health   Financial Resource Strain: Low Risk   . Difficulty of Paying Living Expenses: Not hard  at all  Food Insecurity: No Food Insecurity  . Worried About Programme researcher, broadcasting/film/video in the Last Year: Never true  . Ran Out of Food in the Last Year: Never true  Transportation Needs: No Transportation Needs  . Lack of Transportation (Medical): No  . Lack of Transportation (Non-Medical): No  Physical Activity: Sufficiently Active  . Days of Exercise per Week: 5 days  . Minutes of Exercise per Session: 30 min  Stress: No Stress Concern Present  . Feeling of Stress : Not at all  Social Connections: Not on file     Family History: The patient's family history includes Atrial fibrillation in her father and nephew; Breast cancer in her maternal aunt and mother; Cancer in her brother, father, and mother; Colon cancer in her brother and father; Diabetes in her father; Heart disease in her father; Osteoporosis in her maternal grandmother and  mother; Uterine cancer in her mother.  ROS:   Please see the history of present illness.     All other systems reviewed and are negative.  EKGs/Labs/Other Studies Reviewed:    The following studies were reviewed today:   EKG:  EKG is  ordered today.  The ekg ordered today demonstrates normal sinus rhythm normal ECG.  Recent Labs: 11/09/2019: ALT 14; TSH 1.62 03/02/2020: BUN 12; Creatinine, Ser 0.67; Hemoglobin 13.0; Platelets 186.0; Potassium 3.9; Sodium 141  Recent Lipid Panel    Component Value Date/Time   CHOL 197 11/09/2019 0940   TRIG 104.0 11/09/2019 0940   HDL 62.10 11/09/2019 0940   CHOLHDL 3 11/09/2019 0940   VLDL 20.8 11/09/2019 0940   LDLCALC 114 (H) 11/09/2019 0940     Risk Assessment/Calculations:      Physical Exam:    VS:  BP 130/70 (BP Location: Right Arm, Patient Position: Sitting, Cuff Size: Normal)   Pulse 61   Ht 5' (1.524 m)   Wt 110 lb 4 oz (50 kg)   SpO2 98%   BMI 21.53 kg/m     Wt Readings from Last 3 Encounters:  03/07/20 110 lb 4 oz (50 kg)  03/02/20 109 lb 3.2 oz (49.5 kg)  02/24/20 109 lb (49.4 kg)     GEN:  Well nourished, well developed in no acute distress HEENT: Normal NECK: No JVD; No carotid bruits LYMPHATICS: No lymphadenopathy CARDIAC: RRR, no murmurs, rubs, gallops RESPIRATORY:  Clear to auscultation without rales, wheezing or rhonchi  ABDOMEN: Soft, non-tender, non-distended MUSCULOSKELETAL:  No edema; No deformity  SKIN: Warm and dry NEUROLOGIC:  Alert and oriented x 3 PSYCHIATRIC:  Normal affect   ASSESSMENT:    1. Syncope, unspecified syncope type    PLAN:    In order of problems listed above:  1. Syncope, appears vasovagal in etiology with prodromes of dizziness, nausea, blurred vision.  Patient denies cardiac symptoms of chest pain, shortness of breath.  Advised to obtain echocardiogram as scheduled in about 10 days.  Will place cardiac monitor to evaluate any significant arrhythmias.  I believe patient  symptoms secondary to vasovagal in etiology.  Follow-up after echo and cardiac monitor.     Medication Adjustments/Labs and Tests Ordered: Current medicines are reviewed at length with the patient today.  Concerns regarding medicines are outlined above.  Orders Placed This Encounter  Procedures  . LONG TERM MONITOR (3-14 DAYS)  . EKG 12-Lead   No orders of the defined types were placed in this encounter.   Patient Instructions  Medication Instructions:  Your physician recommends that you  continue on your current medications as directed. Please refer to the Current Medication list given to you today.  *If you need a refill on your cardiac medications before your next appointment, please call your pharmacy*   Lab Work: None ordered If you have labs (blood work) drawn today and your tests are completely normal, you will receive your results only by: Marland Kitchen MyChart Message (if you have MyChart) OR . A paper copy in the mail If you have any lab test that is abnormal or we need to change your treatment, we will call you to review the results.   Testing/Procedures:  1.  Please keep your appointment as scheduled for your Echocardiogram on 03/17/20.  2.  Your physician has recommended that you wear a Zio monitor for 10 days. This monitor is a medical device that records the heart's electrical activity. Doctors most often use these monitors to diagnose arrhythmias. Arrhythmias are problems with the speed or rhythm of the heartbeat. The monitor is a small device applied to your chest. You can wear one while you do your normal daily activities. While wearing this monitor if you have any symptoms to push the button and record what you felt. Once you have worn this monitor for the period of time provider prescribed (Usually 14 days), you will return the monitor device in the postage paid box. Once it is returned they will download the data collected and provide [place] with a report which the provider will  then review and we will call you with those results. Important tips:  1. Avoid showering during the first 24 hours of wearing the monitor. 2. Avoid excessive sweating to help maximize wear time. 3. Do not submerge the device, no hot tubs, and no swimming pools. 4. Keep any lotions or oils away from the patch. 5. After 24 hours you may shower with the patch on. Take brief showers with your back facing the shower head.  6. Do not remove patch once it has been placed because that will interrupt data and decrease adhesive wear time. 7. Push the button when you have any symptoms and write down what you were feeling. 8. Once you have completed wearing your monitor, remove and place into box which has postage paid and place in your outgoing mailbox.  9. If for some reason you have misplaced your box then call our office and we can provide another box and/or mail it off for you.        Follow-Up: At [facility], you and your health needs are our priority.  As part of our continuing mission to provide you with exceptional heart care, we have created designated Provider Care Teams.  These Care Teams include your primary Cardiologist (physician) and Advanced Practice Providers (APPs -  Physician Assistants and Nurse Practitioners) who all work together to provide you with the care you need, when you need it.  We recommend signing up for the patient portal called "MyChart".  Sign up information is provided on this After Visit Summary.  MyChart is used to connect with patients for Virtual Visits (Telemedicine).  Patients are able to view lab/test results, encounter notes, upcoming appointments, etc.  Non-urgent messages can be sent to your provider as well.   To learn more about what you can do with MyChart, go to [email or web address].    Your next appointment:   4-5 week(s)  The format for your next appointment:   In Person  Provider:   Debbe Odea, MD   Other  Instructions       Signed, Debbe Odea, MD  03/07/2020 4:45 PM    [facility]

## 2020-03-16 DIAGNOSIS — R55 Syncope and collapse: Secondary | ICD-10-CM | POA: Diagnosis not present

## 2020-03-17 ENCOUNTER — Other Ambulatory Visit: Payer: Self-pay

## 2020-03-17 ENCOUNTER — Ambulatory Visit (INDEPENDENT_AMBULATORY_CARE_PROVIDER_SITE_OTHER): Payer: Medicare Other

## 2020-03-17 DIAGNOSIS — I959 Hypotension, unspecified: Secondary | ICD-10-CM | POA: Diagnosis not present

## 2020-03-17 LAB — ECHOCARDIOGRAM COMPLETE
AR max vel: 2.87 cm2
AV Area VTI: 2.32 cm2
AV Area mean vel: 2.68 cm2
AV Mean grad: 4 mmHg
AV Peak grad: 8.2 mmHg
Ao pk vel: 1.43 m/s
Area-P 1/2: 2.07 cm2
Calc EF: 56 %
P 1/2 time: 292 msec
S' Lateral: 3.8 cm
Single Plane A2C EF: 57.9 %
Single Plane A4C EF: 54.1 %

## 2020-04-01 ENCOUNTER — Ambulatory Visit (INDEPENDENT_AMBULATORY_CARE_PROVIDER_SITE_OTHER): Payer: Medicare Other | Admitting: Cardiology

## 2020-04-01 ENCOUNTER — Other Ambulatory Visit: Payer: Self-pay

## 2020-04-01 ENCOUNTER — Encounter: Payer: Self-pay | Admitting: Cardiology

## 2020-04-01 VITALS — BP 140/80 | HR 71 | Ht 60.0 in | Wt 109.1 lb

## 2020-04-01 DIAGNOSIS — I77810 Thoracic aortic ectasia: Secondary | ICD-10-CM | POA: Diagnosis not present

## 2020-04-01 DIAGNOSIS — I351 Nonrheumatic aortic (valve) insufficiency: Secondary | ICD-10-CM | POA: Diagnosis not present

## 2020-04-01 DIAGNOSIS — R55 Syncope and collapse: Secondary | ICD-10-CM

## 2020-04-01 NOTE — Progress Notes (Signed)
Cardiology Office Note:    Date:  04/01/2020   ID:  Beverly Lowe, DOB 11-16-1945, MRN [id number]  PCP:  Beverly Lowe, Beverly Spillers, MD  [facility] Cardiologist:  Beverly Odea, MD  [facility] Electrophysiologist:  None   Referring MD: Beverly Lowe, Beverly Lowe *   Chief Complaint  Patient presents with  . Other    4-5 wk f/u echo and zio. Meds reviewed verbally with pt.    History of Present Illness:    Beverly Lowe is a 74 y.o. female with a hx of anxiety who presents for follow-up.  Last seen due to dizziness, syncope.  Her symptoms were deemed likely vasovagal in etiology.  Cardiac monitor and echo was performed to rule out cardiac etiology.   She feels okay, has no concerns at this time. Denies any further episodes of palpitations. Presents for echocardiogram results.  Past Medical History:  Diagnosis Date  . Anxiety    did not like the way ssri made her feel ? name  . Colon polyps   . Hemorrhoids   . Hemorrhoids   . Herpes    oral  . Shingles    right back/flank remotely lasting 9 months of pain was on gabapentin  . Tinnitus     Past Surgical History:  Procedure Laterality Date  . COLONOSCOPY    . dental implants      Current Medications: Current Meds  Medication Sig  . ALPRAZolam (XANAX) 0.25 MG tablet Take 0.5 tablets (0.125 mg total) by mouth daily as needed for anxiety.  . hydrocortisone (PROCTOZONE-HC) 2.5 % rectal cream Place 1 application rectally 2 (two) times daily. Prn  . polyethylene glycol (MIRALAX / GLYCOLAX) 17 g packet Take 17 g by mouth as needed.  . valACYclovir (VALTREX) 1000 MG tablet Take 2 tablets (2,000 mg total) by mouth 2 (two) times daily as needed. X 1 day outbreak prn     Allergies:   Gluten meal   Social History   Socioeconomic History  . Marital status: Married    Spouse name: Not on file  . Number of children: Not on file  . Years of education: Not on file  . Highest education level: Not on file   Occupational History  . Not on file  Tobacco Use  . Smoking status: Never Smoker  . Smokeless tobacco: Never Used  Vaping Use  . Vaping Use: Never used  Substance and Sexual Activity  . Alcohol use: Yes    Comment: socially  . Drug use: Never  . Sexual activity: Yes  Other Topics Concern  . Not on file  Social History Narrative   Moved from [facility] in 3 or 05/2018 to be near son and grandkids    Never smoker    Exercise yes      DPR Husband Beverly Lowe [phone]   Social Determinants of Health   Financial Resource Strain: Low Risk   . Difficulty of Paying Living Expenses: Not hard at all  Food Insecurity: No Food Insecurity  . Worried About Programme researcher, broadcasting/film/video in the Last Year: Never true  . Ran Out of Food in the Last Year: Never true  Transportation Needs: No Transportation Needs  . Lack of Transportation (Medical): No  . Lack of Transportation (Non-Medical): No  Physical Activity: Sufficiently Active  . Days of Exercise per Week: 5 days  . Minutes of Exercise per Session: 30 min  Stress: No Stress Concern Present  . Feeling of Stress : Not at all  Social Connections: Not on file     Family History: The patient's family history includes Atrial fibrillation in her father and nephew; Breast cancer in her maternal aunt and mother; Cancer in her brother, father, and mother; Colon cancer in her brother and father; Diabetes in her father; Heart disease in her father; Osteoporosis in her maternal grandmother and mother; Uterine cancer in her mother.  ROS:   Please see the history of present illness.     All other systems reviewed and are negative.  EKGs/Labs/Other Studies Reviewed:    The following studies were reviewed today:   EKG:  EKG is  ordered today.  The ekg ordered today demonstrates normal sinus rhythm normal ECG.  Recent Labs: 11/09/2019: ALT 14; TSH 1.62 03/02/2020: BUN 12; Creatinine, Ser 0.67; Hemoglobin 13.0; Platelets 186.0; Potassium 3.9;  Sodium 141  Recent Lipid Panel    Component Value Date/Time   CHOL 197 11/09/2019 0940   TRIG 104.0 11/09/2019 0940   HDL 62.10 11/09/2019 0940   CHOLHDL 3 11/09/2019 0940   VLDL 20.8 11/09/2019 0940   LDLCALC 114 (H) 11/09/2019 0940     Risk Assessment/Calculations:      Physical Exam:    VS:  BP 140/80 (BP Location: Left Arm, Patient Position: Sitting, Cuff Size: Normal)   Pulse 71   Ht 5' (1.524 m)   Wt 109 lb 2 oz (49.5 kg)   SpO2 98%   BMI 21.31 kg/m     Wt Readings from Last 3 Encounters:  04/01/20 109 lb 2 oz (49.5 kg)  03/07/20 110 lb 4 oz (50 kg)  03/02/20 109 lb 3.2 oz (49.5 kg)     GEN:  Well nourished, well developed in no acute distress HEENT: Normal NECK: No JVD; No carotid bruits LYMPHATICS: No lymphadenopathy CARDIAC: RRR, no murmurs, rubs, gallops RESPIRATORY:  Clear to auscultation without rales, wheezing or rhonchi  ABDOMEN: Soft, non-tender, non-distended MUSCULOSKELETAL:  No edema; No deformity  SKIN: Warm and dry NEUROLOGIC:  Alert and oriented x 3 PSYCHIATRIC:  Normal affect   ASSESSMENT:    1. Syncope, unspecified syncope type   2. Aortic valve insufficiency, etiology of cardiac valve disease unspecified   3. Aortic root dilation (HCC)    PLAN:    In order of problems listed above:  1. Syncope, appears vasovagal in etiology with prodromes of dizziness, nausea, blurred vision.  Patient denies cardiac symptoms of chest pain, shortness of breath.  Cardiac monitor with no significant arrhythmias, echocardiogram showed preserved ejection fraction, EF 55 to 60%.  No gross structural abnormalities.  Patient made aware of results and reassured.  Etiology for syncope likely vasovagal. 2. Mild to moderate aortic valve regurgitation, monitor with serial echocardiograms. 3. Mild aortic root dilatation measuring 4.2 cm. Plan for repeat echocardiogram in 1 year to ascertain stability or change in size. If stable can increase timing of periodic  monitoring.  Follow-up after echo in 1 year     Medication Adjustments/Labs and Tests Ordered: Current medicines are reviewed at length with the patient today.  Concerns regarding medicines are outlined above.  Orders Placed This Encounter  Procedures  . ECHOCARDIOGRAM COMPLETE   No orders of the defined types were placed in this encounter.   Patient Instructions  Medication Instructions:  Your physician recommends that you continue on your current medications as directed. Please refer to the Current Medication list given to you today.  *If you need a refill on your cardiac medications before your next appointment, please call  your pharmacy*   Lab Work: None ordered If you have labs (blood work) drawn today and your tests are completely normal, you will receive your results only by: Marland Kitchen MyChart Message (if you have MyChart) OR . A paper copy in the mail If you have any lab test that is abnormal or we need to change your treatment, we will call you to review the results.   Testing/Procedures:  1.  Your physician has requested that you have an echocardiogram in 1 year. Echocardiography is a painless test that uses sound waves to create images of your heart. It provides your doctor with information about the size and shape of your heart and how well your heart's chambers and valves are working. This procedure takes approximately one hour. There are no restrictions for this procedure.     Follow-Up: At [facility], you and your health needs are our priority.  As part of our continuing mission to provide you with exceptional heart care, we have created designated Provider Care Teams.  These Care Teams include your primary Cardiologist (physician) and Advanced Practice Providers (APPs -  Physician Assistants and Nurse Practitioners) who all work together to provide you with the care you need, when you need it.  We recommend signing up for the patient portal called "MyChart".  Sign  up information is provided on this After Visit Summary.  MyChart is used to connect with patients for Virtual Visits (Telemedicine).  Patients are able to view lab/test results, encounter notes, upcoming appointments, etc.  Non-urgent messages can be sent to your provider as well.   To learn more about what you can do with MyChart, go to [email or web address].    Your next appointment:   1 year(s)  The format for your next appointment:   In Person  Provider:   Debbe Odea, MD   Other Instructions      Signed, Beverly Odea, MD  04/01/2020 4:23 PM    [facility]

## 2020-04-01 NOTE — Patient Instructions (Signed)
Medication Instructions:  Your physician recommends that you continue on your current medications as directed. Please refer to the Current Medication list given to you today.  *If you need a refill on your cardiac medications before your next appointment, please call your pharmacy*   Lab Work: None ordered If you have labs (blood work) drawn today and your tests are completely normal, you will receive your results only by: . MyChart Message (if you have MyChart) OR . A paper copy in the mail If you have any lab test that is abnormal or we need to change your treatment, we will call you to review the results.   Testing/Procedures:  1.  Your physician has requested that you have an echocardiogram in 1 year. Echocardiography is a painless test that uses sound waves to create images of your heart. It provides your doctor with information about the size and shape of your heart and how well your heart's chambers and valves are working. This procedure takes approximately one hour. There are no restrictions for this procedure.     Follow-Up: At [facility], you and your health needs are our priority.  As part of our continuing mission to provide you with exceptional heart care, we have created designated Provider Care Teams.  These Care Teams include your primary Cardiologist (physician) and Advanced Practice Providers (APPs -  Physician Assistants and Nurse Practitioners) who all work together to provide you with the care you need, when you need it.  We recommend signing up for the patient portal called "MyChart".  Sign up information is provided on this After Visit Summary.  MyChart is used to connect with patients for Virtual Visits (Telemedicine).  Patients are able to view lab/test results, encounter notes, upcoming appointments, etc.  Non-urgent messages can be sent to your provider as well.   To learn more about what you can do with MyChart, go to [email or web address].    Your next  appointment:   1 year(s)  The format for your next appointment:   In Person  Provider:   Brian Agbor-Etang, MD   Other Instructions   

## 2020-04-04 NOTE — Progress Notes (Signed)
Reviewed and agree with documentation and assessment and plan. K. Veena Lasasha Brophy , MD   

## 2020-04-08 ENCOUNTER — Ambulatory Visit: Payer: Medicare Other | Admitting: Cardiology

## 2020-05-09 ENCOUNTER — Other Ambulatory Visit: Payer: Self-pay | Admitting: Internal Medicine

## 2020-05-09 DIAGNOSIS — K649 Unspecified hemorrhoids: Secondary | ICD-10-CM

## 2020-06-01 ENCOUNTER — Ambulatory Visit: Payer: Medicare Other | Admitting: Internal Medicine

## 2020-06-06 ENCOUNTER — Encounter: Payer: Self-pay | Admitting: Adult Health

## 2020-06-06 ENCOUNTER — Telehealth (INDEPENDENT_AMBULATORY_CARE_PROVIDER_SITE_OTHER): Payer: Medicare Other | Admitting: Adult Health

## 2020-06-06 ENCOUNTER — Encounter: Payer: Self-pay | Admitting: Internal Medicine

## 2020-06-06 VITALS — Ht 60.0 in | Wt 105.0 lb

## 2020-06-06 DIAGNOSIS — J0140 Acute pansinusitis, unspecified: Secondary | ICD-10-CM | POA: Diagnosis not present

## 2020-06-06 MED ORDER — AMOXICILLIN-POT CLAVULANATE 875-125 MG PO TABS
1.0000 | ORAL_TABLET | Freq: Two times a day (BID) | ORAL | 0 refills | Status: DC
Start: 2020-06-06 — End: 2020-10-21

## 2020-06-06 NOTE — Progress Notes (Signed)
Virtual Visit via Video Note  I connected with Beverly Lowe on 06/06/20 at  4:30 PM EDT by a video enabled telemedicine application and verified that I am speaking with the correct person using two identifiers. Parties involved in visit as below:   Location: Patient: at home  Provider: Provider: Provider's office at  [facility], [place].      I discussed the limitations of evaluation and management by telemedicine and the availability of in person appointments. The patient expressed understanding and agreed to proceed.  History of Present Illness: Patient is a 75 year old female  In no acute distress, sinus pressure and pain in sinuses. Temperature 101.  Soreness to all teeth.  She tested yesterday for Covid and is negative.  Denies any chest  Congestion or chest tightness.  Patient  denies any fever, body aches,chills, rash, chest pain, shortness of breath, nausea, vomiting, or diarrhea.     Observations/Objective: Video visit :   Patient is alert and oriented and responsive to questions Engages in conversation with provider. Speaks in full sentences without any pauses without any shortness of breath or distress.   Assessment and Plan:  Acute non-recurrent pansinusitis - Plan: amoxicillin-clavulanate (AUGMENTIN) 875-125 MG tablet  Medication how to take and when to take discussed as well as possible side effects.   Return if symptoms worsen or fail to improve, for at any time for any worsening symptoms, Go to Emergency room/ urgent care if worse. Follow Up Instructions:   Red Flags discussed. The patient was given clear instructions to go to ER or return to medical center if any red flags develop, symptoms do not improve, worsen or new problems develop. They verbalized understanding.  I discussed the assessment and treatment plan with the patient. The patient was provided an opportunity to ask questions and all were answered. The patient  agreed with the plan and demonstrated an understanding of the instructions.   The patient was advised to call back or seek an in-person evaluation if the symptoms worsen or if the condition fails to improve as anticipated.   Jairo Ben, FNP

## 2020-06-06 NOTE — Patient Instructions (Signed)

## 2020-06-08 ENCOUNTER — Encounter: Payer: Self-pay | Admitting: Adult Health

## 2020-06-08 ENCOUNTER — Ambulatory Visit: Payer: Medicare Other | Admitting: Internal Medicine

## 2020-06-09 ENCOUNTER — Ambulatory Visit: Payer: Medicare Other | Admitting: Internal Medicine

## 2020-06-10 ENCOUNTER — Other Ambulatory Visit: Payer: Self-pay | Admitting: Internal Medicine

## 2020-06-10 ENCOUNTER — Encounter: Payer: Self-pay | Admitting: Adult Health

## 2020-06-10 DIAGNOSIS — J329 Chronic sinusitis, unspecified: Secondary | ICD-10-CM

## 2020-06-10 MED ORDER — PREDNISONE 20 MG PO TABS: 20.0000 mg | ORAL_TABLET | Freq: Every day | ORAL | 0 refills | Status: DC

## 2020-06-28 ENCOUNTER — Ambulatory Visit: Payer: Medicare Other | Admitting: Internal Medicine

## 2020-06-29 ENCOUNTER — Ambulatory Visit (INDEPENDENT_AMBULATORY_CARE_PROVIDER_SITE_OTHER): Payer: Medicare Other | Admitting: Internal Medicine

## 2020-06-29 ENCOUNTER — Encounter: Payer: Self-pay | Admitting: Internal Medicine

## 2020-06-29 ENCOUNTER — Other Ambulatory Visit: Payer: Self-pay

## 2020-06-29 VITALS — BP 126/78 | HR 66 | Temp 97.6°F | Ht 60.0 in | Wt 108.0 lb

## 2020-06-29 DIAGNOSIS — J329 Chronic sinusitis, unspecified: Secondary | ICD-10-CM

## 2020-06-29 DIAGNOSIS — R1310 Dysphagia, unspecified: Secondary | ICD-10-CM

## 2020-06-29 DIAGNOSIS — R490 Dysphonia: Secondary | ICD-10-CM

## 2020-06-29 DIAGNOSIS — R0982 Postnasal drip: Secondary | ICD-10-CM | POA: Diagnosis not present

## 2020-06-29 MED ORDER — IPRATROPIUM BROMIDE 0.03 % NA SOLN: 2.0000 | Freq: Three times a day (TID) | NASAL | 5 refills | Status: DC | PRN

## 2020-06-29 NOTE — Progress Notes (Signed)
Chief Complaint  Patient presents with  . Follow-up   F/u  1. Sinusitis resolved 2. Dysphagia with eggs and liquid will refer back to GI she gets choke and this gets stuck in throat and clearing throat w/o relief 3. C/o PND and hoarseness   Review of Systems  Constitutional: Negative for weight loss.  HENT:       +hoarse voice +dysphagia  Eyes: Negative for blurred vision.  Cardiovascular: Negative for chest pain.  Gastrointestinal:       +dysphagia   Skin: Negative for rash.   Past Medical History:  Diagnosis Date  . Anxiety    did not like the way ssri made her feel ? name  . Colon polyps   . Hemorrhoids   . Hemorrhoids   . Herpes    oral  . Shingles    right back/flank remotely lasting 9 months of pain was on gabapentin  . Tinnitus    Past Surgical History:  Procedure Laterality Date  . COLONOSCOPY    . dental implants     Family History  Problem Relation Age of Onset  . Cancer Mother        breast later in 33s and uterine  . Breast cancer Mother        late 77's  . Osteoporosis Mother   . Uterine cancer Mother   . Cancer Father        colon  . Colon cancer Father   . Diabetes Father   . Heart disease Father   . Atrial fibrillation Father   . Cancer Brother        colon  . Colon cancer Brother   . Breast cancer Maternal Aunt   . Osteoporosis Maternal Grandmother   . Atrial fibrillation Nephew    Social History   Socioeconomic History  . Marital status: Married    Spouse name: Not on file  . Number of children: Not on file  . Years of education: Not on file  . Highest education level: Not on file  Occupational History  . Not on file  Tobacco Use  . Smoking status: Never Smoker  . Smokeless tobacco: Never Used  Vaping Use  . Vaping Use: Never used  Substance and Sexual Activity  . Alcohol use: Yes    Comment: socially  . Drug use: Never  . Sexual activity: Yes  Other Topics Concern  . Not on file  Social History Narrative   Moved  from [facility] in 3 or 05/2018 to be near son and grandkids    Never smoker    Exercise yes      DPR Husband Lou Irigoyen [phone]   Social Determinants of Health   Financial Resource Strain: Low Risk   . Difficulty of Paying Living Expenses: Not hard at all  Food Insecurity: No Food Insecurity  . Worried About Programme researcher, broadcasting/film/video in the Last Year: Never true  . Ran Out of Food in the Last Year: Never true  Transportation Needs: No Transportation Needs  . Lack of Transportation (Medical): No  . Lack of Transportation (Non-Medical): No  Physical Activity: Sufficiently Active  . Days of Exercise per Week: 5 days  . Minutes of Exercise per Session: 30 min  Stress: No Stress Concern Present  . Feeling of Stress : Not at all  Social Connections: Not on file  Intimate Partner Violence: Not At Risk  . Fear of Current or Ex-Partner: No  . Emotionally Abused: No  .  Physically Abused: No  . Sexually Abused: No   Current Meds  Medication Sig  . ALPRAZolam (XANAX) 0.25 MG tablet Take 0.5 tablets (0.125 mg total) by mouth daily as needed for anxiety.  . Ascorbic Acid (VITAMIN C PO) Take by mouth.  Marland Kitchen CALCIUM PO Take by mouth.  . hydrocortisone (ANUSOL-HC) 2.5 % rectal cream APPLY RECTALLY TO THE AFFECTED AREA TWICE DAILY AS NEEDED  . ipratropium (ATROVENT) 0.03 % nasal spray Place 2 sprays into both nostrils 3 (three) times daily as needed for rhinitis.  Marland Kitchen MAGNESIUM PO Take by mouth.  . Multiple Vitamins-Minerals (ZINC PO) Take by mouth.  . polyethylene glycol (MIRALAX / GLYCOLAX) 17 g packet Take 17 g by mouth as needed.  Marland Kitchen VITAMIN D PO Take by mouth.   Allergies  Allergen Reactions  . Gluten Meal Other (See Comments)    Bloating and abdominal cramps   No results found for this or any previous visit (from the past 2160 hour(s)). Objective  Body mass index is 21.09 kg/m. Wt Readings from Last 3 Encounters:  06/29/20 108 lb (49 kg)  06/06/20 105 lb (47.6 kg)  04/01/20 109 lb  2 oz (49.5 kg)   Temp Readings from Last 3 Encounters:  06/29/20 97.6 F (36.4 C) (Oral)  03/02/20 (!) 97.5 F (36.4 C) (Oral)  11/05/19 98 F (36.7 C) (Oral)   BP Readings from Last 3 Encounters:  06/29/20 126/78  04/01/20 140/80  03/07/20 130/70   Pulse Readings from Last 3 Encounters:  06/29/20 66  04/01/20 71  03/07/20 61    Physical Exam Vitals and nursing note reviewed.  Constitutional:      Appearance: Normal appearance. She is well-developed and well-groomed.  HENT:     Head: Normocephalic and atraumatic.  Eyes:     Conjunctiva/sclera: Conjunctivae normal.     Pupils: Pupils are equal, round, and reactive to light.  Cardiovascular:     Rate and Rhythm: Normal rate and regular rhythm.     Heart sounds: No murmur heard.   Pulmonary:     Effort: Pulmonary effort is normal.     Breath sounds: Normal breath sounds.  Abdominal:     General: Abdomen is flat. Bowel sounds are normal.  Skin:    General: Skin is warm and dry.  Neurological:     General: No focal deficit present.     Mental Status: She is alert and oriented to person, place, and time. Mental status is at baseline.     Gait: Gait normal.  Psychiatric:        Attention and Perception: Attention and perception normal.        Mood and Affect: Mood and affect normal.        Speech: Speech normal.        Behavior: Behavior normal. Behavior is cooperative.        Thought Content: Thought content normal.        Cognition and Memory: Cognition and memory normal.        Judgment: Judgment normal.     Assessment  Plan  Dysphagia, unspecified type - Plan: Ambulatory referral to Gastroenterology consider EGD   PND (post-nasal drip) - Plan: ipratropium (ATROVENT) 0.03 % nasal spray 1/2 dose zyrtec or claritin or xyzal or allegra at night  Sinusitis, unspecified chronicity, unspecified location Hoarseness of voice   HM Flu shot utd Never had prevnar or pna 23 or shingrix disc'ed today consider rec  Tdap been >10 yearsconsider  covid had  3/3 consider 4th dose   Pap out of age window  Mammogram10/2021 ordered had 11/2018, 02/09/20 neg DEXA10/2020 +osteoporosis T score -4.1 -declines proliaor meds to tx 11/05/19  Colonoscopy had in 2019 FH brother and dad colon cancer due in 5 years9/20/2024 h/o polyps personally mod IH, divert.tortuous colon,x2 polpys Dr. Allene Pyo Kaur9/20/19  Skin: currently no issues h/o cysts removal no skin cancer  Supplements zinc, calcium, vitamin D   Provider: Dr. [name] [name]-Internal Medicine

## 2020-06-29 NOTE — Patient Instructions (Addendum)
1/2 dose zyrtec or claritin or xyzal or allegra at night Consider 4th covid dose pfizer   Try atrovent up to 3 xper day    Sinusitis, Adult Sinusitis is inflammation of your sinuses. Sinuses are hollow spaces in the bones around your face. Your sinuses are located:  Around your eyes.  In the middle of your forehead.  Behind your nose.  In your cheekbones. Mucus normally drains out of your sinuses. When your nasal tissues become inflamed or swollen, mucus can become trapped or blocked. This allows bacteria, viruses, and fungi to grow, which leads to infection. Most infections of the sinuses are caused by a virus. Sinusitis can develop quickly. It can last for up to 4 weeks (acute) or for more than 12 weeks (chronic). Sinusitis often develops after a cold. What are the causes? This condition is caused by anything that creates swelling in the sinuses or stops mucus from draining. This includes:  Allergies.  Asthma.  Infection from bacteria or viruses.  Deformities or blockages in your nose or sinuses.  Abnormal growths in the nose (nasal polyps).  Pollutants, such as chemicals or irritants in the air.  Infection from fungi (rare). What increases the risk? You are more likely to develop this condition if you:  Have a weak body defense system (immune system).  Do a lot of swimming or diving.  Overuse nasal sprays.  Smoke. What are the signs or symptoms? The main symptoms of this condition are pain and a feeling of pressure around the affected sinuses. Other symptoms include:  Stuffy nose or congestion.  Thick drainage from your nose.  Swelling and warmth over the affected sinuses.  Headache.  Upper toothache.  A cough that may get worse at night.  Extra mucus that collects in the throat or the back of the nose (postnasal drip).  Decreased sense of smell and taste.  Fatigue.  A fever.  Sore throat.  Bad breath. How is this diagnosed? This condition is  diagnosed based on:  Your symptoms.  Your medical history.  A physical exam.  Tests to find out if your condition is acute or chronic. This may include: ? Checking your nose for nasal polyps. ? Viewing your sinuses using a device that has a light (endoscope). ? Testing for allergies or bacteria. ? Imaging tests, such as an MRI or CT scan. In rare cases, a bone biopsy may be done to rule out more serious types of fungal sinus disease. How is this treated? Treatment for sinusitis depends on the cause and whether your condition is chronic or acute.  If caused by a virus, your symptoms should go away on their own within 10 days. You may be given medicines to relieve symptoms. They include: ? Medicines that shrink swollen nasal passages (topical intranasal decongestants). ? Medicines that treat allergies (antihistamines). ? A spray that eases inflammation of the nostrils (topical intranasal corticosteroids). ? Rinses that help get rid of thick mucus in your nose (nasal saline washes).  If caused by bacteria, your health care provider may recommend waiting to see if your symptoms improve. Most bacterial infections will get better without antibiotic medicine. You may be given antibiotics if you have: ? A severe infection. ? A weak immune system.  If caused by narrow nasal passages or nasal polyps, you may need to have surgery. Follow these instructions at home: Medicines  Take, use, or apply over-the-counter and prescription medicines only as told by your health care provider. These may include nasal  sprays.  If you were prescribed an antibiotic medicine, take it as told by your health care provider. Do not stop taking the antibiotic even if you start to feel better. Hydrate and humidify  Drink enough fluid to keep your urine pale yellow. Staying hydrated will help to thin your mucus.  Use a cool mist humidifier to keep the humidity level in your home above 50%.  Inhale steam for  10-15 minutes, 3-4 times a day, or as told by your health care provider. You can do this in the bathroom while a hot shower is running.  Limit your exposure to cool or dry air.   Rest  Rest as much as possible.  Sleep with your head raised (elevated).  Make sure you get enough sleep each night. General instructions  Apply a warm, moist washcloth to your face 3-4 times a day or as told by your health care provider. This will help with discomfort.  Wash your hands often with soap and water to reduce your exposure to germs. If soap and water are not available, use hand sanitizer.  Do not smoke. Avoid being around people who are smoking (secondhand smoke).  Keep all follow-up visits as told by your health care provider. This is important.   Contact a health care provider if:  You have a fever.  Your symptoms get worse.  Your symptoms do not improve within 10 days. Get help right away if:  You have a severe headache.  You have persistent vomiting.  You have severe pain or swelling around your face or eyes.  You have vision problems.  You develop confusion.  Your neck is stiff.  You have trouble breathing. Summary  Sinusitis is soreness and inflammation of your sinuses. Sinuses are hollow spaces in the bones around your face.  This condition is caused by nasal tissues that become inflamed or swollen. The swelling traps or blocks the flow of mucus. This allows bacteria, viruses, and fungi to grow, which leads to infection.  If you were prescribed an antibiotic medicine, take it as told by your health care provider. Do not stop taking the antibiotic even if you start to feel better.  Keep all follow-up visits as told by your health care provider. This is important. This information is not intended to replace advice given to you by your health care provider. Make sure you discuss any questions you have with your health care provider. Document Revised: 06/24/2017 Document  Reviewed: 06/24/2017 Elsevier Patient Education  2021 [employer]  Dysphagia  Dysphagia is trouble swallowing. This condition occurs when solids and liquids stick in a person's throat on the way down to the stomach, or when food takes longer to get to the stomach than usual. You may have problems swallowing food, liquids, or both. You may also have pain while trying to swallow. It may take you more time and effort to swallow something. What are the causes? This condition may be caused by:  Muscle problems. These may make it difficult for you to move food and liquids through the esophagus, which is the tube that connects your mouth to your stomach.  Blockages. You may have ulcers, scar tissue, or inflammation that blocks the normal passage of food and liquids. Causes of these problems include: ? Acid reflux from your stomach into your esophagus (gastroesophageal reflux). ? Infections. ? Radiation treatment for cancer. ? Medicines taken without enough fluids to wash them down into your stomach.  Stroke. This can affect the nerves and  make it difficult to swallow.  Nerve problems. These prevent signals from being sent to the muscles of your esophagus to squeeze (contract) and move what you swallow down to your stomach.  Globus pharyngeus. This is a common problem that involves a feeling like something is stuck in your throat or a sense of trouble with swallowing, even though nothing is wrong with the swallowing passages.  Certain conditions, such as cerebral palsy or Parkinson's disease. What are the signs or symptoms? Common symptoms of this condition include:  A feeling that solids or liquids are stuck in your throat on the way down to the stomach.  Pain while swallowing.  Coughing or gagging while trying to swallow. Other symptoms include:  Food moving back from your stomach to your mouth (regurgitation).  Noises coming from your throat.  Chest discomfort when  swallowing.  A feeling of fullness when swallowing.  Drooling, especially when the throat is blocked.  Heartburn. How is this diagnosed? This condition may be diagnosed by:  Barium swallow X-ray. In this test, you will swallow a white liquid that sticks to the inside of your esophagus. X-ray images are then taken.  Endoscopy. In this test, a flexible telescope is inserted down your throat to look at your esophagus and your stomach.  CT scans or an MRI. How is this treated? Treatment for dysphagia depends on the cause of this condition:  If the dysphagia is caused by acid reflux or infection, medicines may be used. These may include antibiotics or heartburn medicines.  If the dysphagia is caused by problems with the muscles, swallowing therapy may be used to help you strengthen your swallowing muscles. You may have to do specific exercises to strengthen the muscles or stretch them.  If the dysphagia is caused by a blockage or mass, procedures to remove the blockage may be done. You may need surgery and a feeding tube. You may need to make diet changes. Ask your health care provider for specific instructions. Follow these instructions at home: Medicines  Take over-the-counter and prescription medicines only as told by your health care provider.  If you were prescribed an antibiotic medicine, take it as told by your health care provider. Do not stop taking the antibiotic even if you start to feel better. Eating and drinking  Make any diet changes as told by your health care provider.  Work with a diet and nutrition specialist (dietitian) to create an eating plan that will help you get the nutrients you need in order to stay healthy.  Eat soft foods that are easier to swallow.  Cut your food into small pieces and eat slowly. Take small bites.  Eat and drink only when you are sitting upright.  Do not drink alcohol or caffeine. If you need help quitting, ask your health care  provider.   General instructions  Check your weight every day to make sure you are not losing weight.  Do not use any products that contain nicotine or tobacco. These products include cigarettes, chewing tobacco, and vaping devices, such as e-cigarettes. If you need help quitting, ask your health care provider.  Keep all follow-up visits. This is important. Contact a health care provider if:  You lose weight because you cannot swallow.  You cough when you drink liquids.  You cough up partially digested food. Get help right away if:  You cannot swallow your saliva.  You have shortness of breath, a fever, or both.  Your voice is hoarse and you have  trouble swallowing. These symptoms may represent a serious problem that is an emergency. Do not wait to see if the symptoms will go away. Get medical help right away. Call your local emergency services (911 in the [place] Do not drive yourself to the hospital. Summary  Dysphagia is trouble swallowing. This condition occurs when solids and liquids stick in a person's throat on the way down to the stomach. You may cough or gag while trying to swallow.  Dysphagia has many possible causes.  Treatment for dysphagia depends on the cause of the condition.  Keep all follow-up visits. This is important. This information is not intended to replace advice given to you by your health care provider. Make sure you discuss any questions you have with your health care provider. Document Revised: 09/12/2019 Document Reviewed: 09/12/2019 Elsevier Patient Education  2021 [employer]  Hoarseness  Hoarseness, also called dysphonia, is any abnormal change in your voice that can make it difficult to speak. Your voice may sound raspy, breathy, or strained. Hoarseness is caused by a problem with your vocal cords (vocal folds). These are two bands of tissue inside your voice box (larynx). When you speak, your vocal cords move back and forth to create sound.  The surfaces of your vocal cords need to be smooth for your voice to sound clear. Swelling or lumps on your vocal cords can cause hoarseness. Common causes of vocal cord problems include:  Infection in the nose, throat, and upper air passages (upper respiratory infection).  A long-term cough.  Straining or overusing your voice.  Smoking, or exposure to secondhand smoke.  Allergies.  Medication side effects.  Vocal cord growths.  Vocal cord injuries.  Stomach acids that move up in your throat and irritate your vocal cords (gastroesophageal reflux).  Diseases that affect the nervous system, such as a stroke or Parkinson's disease. Follow these instructions at home: Watch your condition for any changes. To ease discomfort and protect your vocal cords:  Rest your voice.  Do not whisper. Whispering can cause muscle strain.  Do not speak in a loud or harsh voice.  Avoid coughing or clearing your throat.  Do not use any products that contain nicotine or tobacco, such as cigarettes and e-cigarettes. If you need help quitting, ask your health care provider.  Avoid secondhand smoke.  Do not eat foods that give you heartburn, such as spicy or acidic foods like hot peppers and orange juice. Heartburn can make gastroesophageal reflux worse.  Do not drink beverages that contain caffeine (coffee, tea, or soft drinks) or alcohol (beer, wine, or liquor).  Drink enough fluid to keep your urine pale yellow.  Use a humidifier if the air in your home is dry. If recommended by your health care provider, schedule an appointment with a speech-language specialist. This specialist may give you methods to try that can help you avoid misusing your voice.   Contact a health care provider if:  You have hoarseness that lasts longer than 3 weeks.  You almost lose or completely lose your voice for more than 3 days.  You have pain when you swallow or try to talk.  You feel a lump in your  neck. Get help right away if:  You have trouble swallowing.  You feel like you are choking when you swallow.  You cough up blood or vomit blood.  You have trouble breathing.  You choke, cannot swallow, or cannot breathe if you lie flat.  You notice swelling or a rash  on your body, face, or tongue. Summary  Hoarseness, also called dysphonia, is any abnormal change in your voice that can make it difficult to speak. Your voice may sound raspy, breathy, or strained.  Hoarseness is caused by a problem with your vocal cords (vocal folds).  Do not speak in a loud or harsh voice, use nicotine or tobacco products, or eat foods that give you heartburn.  If recommended by your health care provider, meet with a speech-language specialist. This information is not intended to replace advice given to you by your health care provider. Make sure you discuss any questions you have with your health care provider. Document Revised: 01/04/2017 Document Reviewed: 10/19/2016 Elsevier Patient Education  2021 [employer]  Postnasal Drip Postnasal drip is the feeling of mucus going down the back of your throat. Mucus is a slimy substance that moistens and cleans your nose and throat, as well as the air pockets in face bones near your forehead and cheeks (sinuses). Small amounts of mucus pass from your nose and sinuses down the back of your throat all the time. This is normal. When you produce too much mucus or the mucus gets too thick, you can feel it. Some common causes of postnasal drip include:  Having more mucus because of: ? A cold or the flu. ? Allergies. ? Cold air. ? Certain medicines.  Having more mucus that is thicker because of: ? A sinus or nasal infection. ? Dry air. ? A food allergy. Follow these instructions at home: Relieving discomfort  Gargle with a salt-water mixture 3-4 times a day or as needed. To make a salt-water mixture, completely dissolve -1 tsp of salt in 1 cup of  warm water.  If the air in your home is dry, use a humidifier to add moisture to the air.  Use a saline spray or container (neti pot) to flush out the nose (nasal irrigation). These methods can help clear away mucus and keep the nasal passages moist.   General instructions  Take over-the-counter and prescription medicines only as told by your health care provider.  Follow instructions from your health care provider about eating or drinking restrictions. You may need to avoid caffeine.  Avoid things that you know you are allergic to (allergens), like dust, mold, pollen, pets, or certain foods.  Drink enough fluid to keep your urine pale yellow.  Keep all follow-up visits as told by your health care provider. This is important. Contact a health care provider if:  You have a fever.  You have a sore throat.  You have difficulty swallowing.  You have headache.  You have sinus pain.  You have a cough that does not go away.  The mucus from your nose becomes thick and is green or yellow in color.  You have cold or flu symptoms that last more than 10 days. Summary  Postnasal drip is the feeling of mucus going down the back of your throat.  If your health care provider approves, use nasal irrigation or a nasal spray 2?4 times a day.  Avoid things that you know you are allergic to (allergens), like dust, mold, pollen, pets, or certain foods. This information is not intended to replace advice given to you by your health care provider. Make sure you discuss any questions you have with your health care provider. Document Revised: 11/03/2019 Document Reviewed: 11/03/2019 Elsevier Patient Education  2021 [employer].

## 2020-07-28 ENCOUNTER — Ambulatory Visit: Payer: Medicare Other | Admitting: Dermatology

## 2020-09-09 ENCOUNTER — Encounter: Payer: Self-pay | Admitting: Internal Medicine

## 2020-09-21 ENCOUNTER — Other Ambulatory Visit: Payer: Self-pay

## 2020-09-21 DIAGNOSIS — F419 Anxiety disorder, unspecified: Secondary | ICD-10-CM

## 2020-09-21 MED ORDER — ALPRAZOLAM 0.25 MG PO TABS: 0.1250 mg | ORAL_TABLET | Freq: Every day | ORAL | 5 refills | Status: DC | PRN

## 2020-09-21 NOTE — Telephone Encounter (Signed)
6 month supply last sent 02/19/20 and Patient last seen 06/29/20.

## 2020-10-21 ENCOUNTER — Ambulatory Visit (INDEPENDENT_AMBULATORY_CARE_PROVIDER_SITE_OTHER): Payer: Medicare Other

## 2020-10-21 VITALS — Ht 60.0 in | Wt 108.0 lb

## 2020-10-21 DIAGNOSIS — Z0000 Encounter for general adult medical examination without abnormal findings: Secondary | ICD-10-CM | POA: Diagnosis not present

## 2020-10-21 NOTE — Patient Instructions (Addendum)
Beverly Lowe , Thank you for taking time to come for your Medicare Wellness Visit. I appreciate your ongoing commitment to your health goals. Please review the following plan we discussed and let me know if I can assist you in the future.   These are the goals we discussed:  Goals       Patient Stated     Maintain Healthy Lifestyle (pt-stated)      Stay active Healthy diet        This is a list of the screening recommended for you and due dates:  Health Maintenance  Topic Date Due   Zoster (Shingles) Vaccine (1 of 2) 01/20/2021*   Flu Shot  05/05/2021*   Tetanus Vaccine  10/21/2021*   DEXA scan (bone density measurement)  Completed   COVID-19 Vaccine  Completed   Hepatitis C Screening: USPSTF Recommendation to screen - Ages 78-79 yo.  Completed   HPV Vaccine  Aged Out  *Topic was postponed. The date shown is not the original due date.    Advanced directives: not yet completed  Conditions/risks identified: none new  Follow up in one year for your annual wellness visit    Preventive Care 65 Years and Older, Female Preventive care refers to lifestyle choices and visits with your health care provider that can promote health and wellness. What does preventive care include? A yearly physical exam. This is also called an annual well check. Dental exams once or twice a year. Routine eye exams. Ask your health care provider how often you should have your eyes checked. Personal lifestyle choices, including: Daily care of your teeth and gums. Regular physical activity. Eating a healthy diet. Avoiding tobacco and drug use. Limiting alcohol use. Practicing safe sex. Taking low-dose aspirin every day. Taking vitamin and mineral supplements as recommended by your health care provider. What happens during an annual well check? The services and screenings done by your health care provider during your annual well check will depend on your age, overall health, lifestyle risk factors,  and family history of disease. Counseling  Your health care provider may ask you questions about your: Alcohol use. Tobacco use. Drug use. Emotional well-being. Home and relationship well-being. Sexual activity. Eating habits. History of falls. Memory and ability to understand (cognition). Work and work Astronomer. Reproductive health. Screening  You may have the following tests or measurements: Height, weight, and BMI. Blood pressure. Lipid and cholesterol levels. These may be checked every 5 years, or more frequently if you are over 78 years old. Skin check. Lung cancer screening. You may have this screening every year starting at age 22 if you have a 30-pack-year history of smoking and currently smoke or have quit within the past 15 years. Fecal occult blood test (FOBT) of the stool. You may have this test every year starting at age 61. Flexible sigmoidoscopy or colonoscopy. You may have a sigmoidoscopy every 5 years or a colonoscopy every 10 years starting at age 38. Hepatitis C blood test. Hepatitis B blood test. Sexually transmitted disease (STD) testing. Diabetes screening. This is done by checking your blood sugar (glucose) after you have not eaten for a while (fasting). You may have this done every 1-3 years. Bone density scan. This is done to screen for osteoporosis. You may have this done starting at age 79. Mammogram. This may be done every 1-2 years. Talk to your health care provider about how often you should have regular mammograms. Talk with your health care provider about your test  results, treatment options, and if necessary, the need for more tests. Vaccines  Your health care provider may recommend certain vaccines, such as: Influenza vaccine. This is recommended every year. Tetanus, diphtheria, and acellular pertussis (Tdap, Td) vaccine. You may need a Td booster every 10 years. Zoster vaccine. You may need this after age 60. Pneumococcal 13-valent conjugate  (PCV13) vaccine. One dose is recommended after age 58. Pneumococcal polysaccharide (PPSV23) vaccine. One dose is recommended after age 62. Talk to your health care provider about which screenings and vaccines you need and how often you need them. This information is not intended to replace advice given to you by your health care provider. Make sure you discuss any questions you have with your health care provider. Document Released: 02/18/2015 Document Revised: 10/12/2015 Document Reviewed: 11/23/2014 Elsevier Interactive Patient Education  2017 [place] Prevention in the Home Falls can cause injuries. They can happen to people of all ages. There are many things you can do to make your home safe and to help prevent falls. What can I do on the outside of my home? Regularly fix the edges of walkways and driveways and fix any cracks. Remove anything that might make you trip as you walk through a door, such as a raised step or threshold. Trim any bushes or trees on the path to your home. Use bright outdoor lighting. Clear any walking paths of anything that might make someone trip, such as rocks or tools. Regularly check to see if handrails are loose or broken. Make sure that both sides of any steps have handrails. Any raised decks and porches should have guardrails on the edges. Have any leaves, snow, or ice cleared regularly. Use sand or salt on walking paths during winter. Clean up any spills in your garage right away. This includes oil or grease spills. What can I do in the bathroom? Use night lights. Install grab bars by the toilet and in the tub and shower. Do not use towel bars as grab bars. Use non-skid mats or decals in the tub or shower. If you need to sit down in the shower, use a plastic, non-slip stool. Keep the floor dry. Clean up any water that spills on the floor as soon as it happens. Remove soap buildup in the tub or shower regularly. Attach bath mats securely with  double-sided non-slip rug tape. Do not have throw rugs and other things on the floor that can make you trip. What can I do in the bedroom? Use night lights. Make sure that you have a light by your bed that is easy to reach. Do not use any sheets or blankets that are too big for your bed. They should not hang down onto the floor. Have a firm chair that has side arms. You can use this for support while you get dressed. Do not have throw rugs and other things on the floor that can make you trip. What can I do in the kitchen? Clean up any spills right away. Avoid walking on wet floors. Keep items that you use a lot in easy-to-reach places. If you need to reach something above you, use a strong step stool that has a grab bar. Keep electrical cords out of the way. Do not use floor polish or wax that makes floors slippery. If you must use wax, use non-skid floor wax. Do not have throw rugs and other things on the floor that can make you trip. What can I do with my stairs?  Do not leave any items on the stairs. Make sure that there are handrails on both sides of the stairs and use them. Fix handrails that are broken or loose. Make sure that handrails are as long as the stairways. Check any carpeting to make sure that it is firmly attached to the stairs. Fix any carpet that is loose or worn. Avoid having throw rugs at the top or bottom of the stairs. If you do have throw rugs, attach them to the floor with carpet tape. Make sure that you have a light switch at the top of the stairs and the bottom of the stairs. If you do not have them, ask someone to add them for you. What else can I do to help prevent falls? Wear shoes that: Do not have high heels. Have rubber bottoms. Are comfortable and fit you well. Are closed at the toe. Do not wear sandals. If you use a stepladder: Make sure that it is fully opened. Do not climb a closed stepladder. Make sure that both sides of the stepladder are locked  into place. Ask someone to hold it for you, if possible. Clearly mark and make sure that you can see: Any grab bars or handrails. First and last steps. Where the edge of each step is. Use tools that help you move around (mobility aids) if they are needed. These include: Canes. Walkers. Scooters. Crutches. Turn on the lights when you go into a dark area. Replace any light bulbs as soon as they burn out. Set up your furniture so you have a clear path. Avoid moving your furniture around. If any of your floors are uneven, fix them. If there are any pets around you, be aware of where they are. Review your medicines with your doctor. Some medicines can make you feel dizzy. This can increase your chance of falling. Ask your doctor what other things that you can do to help prevent falls. This information is not intended to replace advice given to you by your health care provider. Make sure you discuss any questions you have with your health care provider. Document Released: 11/18/2008 Document Revised: 06/30/2015 Document Reviewed: 02/26/2014 Elsevier Interactive Patient Education  2017 [employer].

## 2020-10-21 NOTE — Progress Notes (Addendum)
Subjective:   Beverly Lowe is a 75 y.o. female who presents for Medicare Annual (Subsequent) preventive examination.  Review of Systems    No ROS.  Medicare Wellness Virtual Visit.  Visual/audio telehealth visit, UTA vital signs.   See social history for additional risk factors.   Cardiac Risk Factors include: advanced age (>36men, >31 women)     Objective:    Today's Vitals   10/21/20 1313  Weight: 108 lb (49 kg)  Height: 5' (1.524 m)   Body mass index is 21.09 kg/m.  Advanced Directives 10/21/2020 10/21/2019  Does Patient Have a Medical Advance Directive? No No  Does patient want to make changes to medical advance directive? No - Patient declined -  Would patient like information on creating a medical advance directive? - No - Patient declined    Current Medications (verified) Outpatient Encounter Medications as of 10/21/2020  Medication Sig   ALPRAZolam (XANAX) 0.25 MG tablet Take 0.5 tablets (0.125 mg total) by mouth daily as needed for anxiety.   Ascorbic Acid (VITAMIN C PO) Take by mouth.   CALCIUM PO Take by mouth.   hydrocortisone (ANUSOL-HC) 2.5 % rectal cream APPLY RECTALLY TO THE AFFECTED AREA TWICE DAILY AS NEEDED   ipratropium (ATROVENT) 0.03 % nasal spray Place 2 sprays into both nostrils 3 (three) times daily as needed for rhinitis.   MAGNESIUM PO Take by mouth.   Multiple Vitamins-Minerals (ZINC PO) Take by mouth.   polyethylene glycol (MIRALAX / GLYCOLAX) 17 g packet Take 17 g by mouth as needed.   valACYclovir (VALTREX) 1000 MG tablet Take 2 tablets (2,000 mg total) by mouth 2 (two) times daily as needed. X 1 day outbreak prn (Patient not taking: Reported on 06/29/2020)   VITAMIN D PO Take by mouth.   [DISCONTINUED] amoxicillin-clavulanate (AUGMENTIN) 875-125 MG tablet Take 1 tablet by mouth 2 (two) times daily. (Patient not taking: Reported on 06/29/2020)   [DISCONTINUED] predniSONE (DELTASONE) 20 MG tablet Take 1 tablet (20 mg total) by mouth daily with  breakfast. X 5-7 days (Patient not taking: Reported on 06/29/2020)   No facility-administered encounter medications on file as of 10/21/2020.    Allergies (verified) Gluten meal   History: Past Medical History:  Diagnosis Date   Anxiety    did not like the way ssri made her feel ? name   Colon polyps    Hemorrhoids    Hemorrhoids    Herpes    oral   Shingles    right back/flank remotely lasting 9 months of pain was on gabapentin   Tinnitus    Past Surgical History:  Procedure Laterality Date   COLONOSCOPY     dental implants     Family History  Problem Relation Age of Onset   Cancer Mother        breast later in 37s and uterine   Breast cancer Mother        late 88's   Osteoporosis Mother    Uterine cancer Mother    Cancer Father        colon   Colon cancer Father    Diabetes Father    Heart disease Father    Atrial fibrillation Father    Cancer Brother        colon   Colon cancer Brother    Breast cancer Maternal Aunt    Osteoporosis Maternal Grandmother    Atrial fibrillation Nephew    Social History   Socioeconomic History   Marital status: Married  Spouse name: Not on file   Number of children: Not on file   Years of education: Not on file   Highest education level: Not on file  Occupational History   Not on file  Tobacco Use   Smoking status: Never   Smokeless tobacco: Never  Vaping Use   Vaping Use: Never used  Substance and Sexual Activity   Alcohol use: Yes    Comment: socially   Drug use: Never   Sexual activity: Yes  Other Topics Concern   Not on file  Social History Narrative   Moved from [place] in 3 or 05/2018 to be near son and grandkids    Never smoker    Exercise yes      DPR Husband Beverly Lowe [phone]   Social Determinants of Health   Financial Resource Strain: Low Risk    Difficulty of Paying Living Expenses: Not hard at all  Food Insecurity: No Food Insecurity   Worried About Programme researcher, broadcasting/film/video in the Last  Year: Never true   Ran Out of Food in the Last Year: Never true  Transportation Needs: No Transportation Needs   Lack of Transportation (Medical): No   Lack of Transportation (Non-Medical): No  Physical Activity: Sufficiently Active   Days of Exercise per Week: 5 days   Minutes of Exercise per Session: 30 min  Stress: No Stress Concern Present   Feeling of Stress : Not at all  Social Connections: Unknown   Frequency of Communication with Friends and Family: Not on file   Frequency of Social Gatherings with Friends and Family: More than three times a week   Attends Religious Services: Not on Scientist, clinical (histocompatibility and immunogenetics) or Organizations: Not on file   Attends Banker Meetings: Not on file   Marital Status: Not on file    Tobacco Counseling Counseling given: Not Answered   Clinical Intake:  Pre-visit preparation completed: Yes        Diabetes: No  How often do you need to have someone help you when you read instructions, pamphlets, or other written materials from your doctor or pharmacy?: 1 - Never    Interpreter Needed?: No      Activities of Daily Living In your present state of health, do you have any difficulty performing the following activities: 10/21/2020  Hearing? N  Vision? N  Difficulty concentrating or making decisions? N  Walking or climbing stairs? N  Dressing or bathing? N  Doing errands, shopping? N  Preparing Food and eating ? N  Using the Toilet? N  In the past six months, have you accidently leaked urine? N  Do you have problems with loss of bowel control? N  Managing your Medications? N  Managing your Finances? N  Housekeeping or managing your Housekeeping? N  Some recent data might be hidden    Patient Care Team: [name], Pasty Spillers, MD as PCP - General (Internal Medicine) Debbe Odea, MD as PCP - Cardiology (Cardiology)  Indicate any recent Medical Services you may have received from other than [facility] providers  in the past year (date may be approximate).     Assessment:   This is a routine wellness examination for Beverly Lowe.  I connected with Beverly Lowe today by telephone and verified that I am speaking with the correct person using two identifiers. Location patient: home Location provider: work Persons participating in the virtual visit: patient, Engineer, civil (consulting).    I discussed the limitations, risks, security and  privacy concerns of performing an evaluation and management service by telephone and the availability of in person appointments. The patient expressed understanding and verbally consented to this telephonic visit.    Interactive audio and video telecommunications were attempted between this provider and patient, however failed, due to patient having technical difficulties OR patient did not have access to video capability.  We continued and completed visit with audio only.  Some vital signs may be absent or patient reported.   Hearing/Vision screen Hearing Screening - Comments:: Patient is able to hear conversational tones without difficulty. No issues reported.  Vision Screening - Comments:: Cataract extraction, bilateral They have seen their ophthalmologist in the last 12 months.    Dietary issues and exercise activities discussed: Current Exercise Habits: Home exercise routine, Type of exercise: walking;strength training/weights, Time (Minutes): 30, Frequency (Times/Week): 7, Weekly Exercise (Minutes/Week): 210, Intensity: Mild Healthy diet Good water    Goals Addressed               This Visit's Progress     Patient Stated     Maintain Healthy Lifestyle (pt-stated)        Stay active Healthy diet       Depression Screen PHQ 2/9 Scores 10/21/2020 06/06/2020 10/21/2019 05/05/2019 09/17/2018  PHQ - 2 Score 0 0 0 0 0  PHQ- 9 Score - - - - 1    Fall Risk Fall Risk  10/21/2020 06/29/2020 06/06/2020 03/02/2020 11/05/2019  Falls in the past year? 0 0 0 0 0  Number falls in past yr: 0 0 0  0 0  Injury with Fall? 0 0 0 0 0  Follow up Falls evaluation completed Falls evaluation completed Falls evaluation completed Falls evaluation completed Falls evaluation completed    FALL RISK PREVENTION PERTAINING TO THE HOME: Adequate lighting in your home to reduce risk of falls? Yes   ASSISTIVE DEVICES UTILIZED TO PREVENT FALLS: Life alert? No  Use of a cane, walker or w/c? No   TIMED UP AND GO: Was the test performed? No .   Cognitive Function:  Patient is alert and oriented x3.  Denies difficulty focusing, making decisions, memory loss.  Enjoys brain health games and activities as stimulation.   MMSE/6CIT deferred. Normal by direct communication/observation.    Immunizations Immunization History  Administered Date(s) Administered   Fluad Quad(high Dose 65+) 10/03/2018, 11/05/2019   PFIZER(Purple Top)SARS-COV-2 Vaccination 02/12/2019, 03/05/2019, 10/05/2019, 10/04/2020   TDAP status: Due, Education has been provided regarding the importance of this vaccine. Advised may receive this vaccine at local pharmacy or Health Dept. Aware to provide a copy of the vaccination record if obtained from local pharmacy or Health Dept. Verbalized acceptance and understanding. Deferred.   Shingrix vaccine- Due, Education has been provided regarding the importance of this vaccine. Advised may receive this vaccine at local pharmacy or Health Dept. Aware to provide a copy of the vaccination record if obtained from local pharmacy or Health Dept. Verbalized acceptance and understanding. Deferred.   Health Maintenance Health Maintenance  Topic Date Due   Zoster Vaccines- Shingrix (1 of 2) 01/20/2021 (Originally 03/29/1994)   INFLUENZA VACCINE  05/05/2021 (Originally 09/05/2020)   TETANUS/TDAP  10/21/2021 (Originally 03/30/1964)   DEXA SCAN  Completed   COVID-19 Vaccine  Completed   Hepatitis C Screening  Completed   HPV VACCINES  Aged Out   Colonoscopy- completed 2019. 5 year follow up 2024.    Mammogram- completed 02/2021.   Lung Cancer Screening: (Low Dose CT Chest recommended if  Age 69-80 years, 30 pack-year currently smoking OR have quit w/in 15years.) does not qualify.   Hepatitis C Screening: does not qualify.  Vision Screening: Recommended annual ophthalmology exams for early detection of glaucoma and other disorders of the eye.  Dental Screening: Recommended annual dental exams for proper oral hygiene  Community Resource Referral / Chronic Care Management: CRR required this visit?  No   CCM required this visit?  No      Plan:   Keep all routine maintenance appointments.   I have personally reviewed and noted the following in the patient's chart:   Medical and social history Use of alcohol, tobacco or illicit drugs  Current medications and supplements including opioid prescriptions. Not taking opioid.  Functional ability and status Nutritional status Physical activity Advanced directives List of other physicians Hospitalizations, surgeries, and ER visits in previous 12 months Vitals Screenings to include cognitive, depression, and falls Referrals and appointments  In addition, I have reviewed and discussed with patient certain preventive protocols, quality metrics, and best practice recommendations. A written personalized care plan for preventive services as well as general preventive health recommendations were provided to patient via mychart.     OBrien-Blaney, Gusta Marksberry L, LPN   02/15/5518     I have reviewed the above information and agree with above.   Duncan Dull, MD

## 2020-11-08 ENCOUNTER — Ambulatory Visit: Payer: Medicare Other | Admitting: Physician Assistant

## 2020-11-09 ENCOUNTER — Encounter: Payer: Self-pay | Admitting: Physician Assistant

## 2020-11-09 ENCOUNTER — Ambulatory Visit (INDEPENDENT_AMBULATORY_CARE_PROVIDER_SITE_OTHER): Payer: Medicare Other | Admitting: Physician Assistant

## 2020-11-09 VITALS — BP 120/70 | HR 72 | Ht 60.0 in | Wt 110.0 lb

## 2020-11-09 DIAGNOSIS — R0989 Other specified symptoms and signs involving the circulatory and respiratory systems: Secondary | ICD-10-CM | POA: Diagnosis not present

## 2020-11-09 NOTE — Patient Instructions (Addendum)
IMAGING:  You will be contacted by [facility] Scheduling (Your caller ID will indicate phone # [phone]) within the next business 7-10 business days to schedule your Barium esophagram. If you have not heard from them within 7-10 business days, please call [facility] Scheduling at [phone] to follow up on the status of your appointment.    Try Omeprazole 20 mg OTC daily 30-60 minutes before breakfast for 2 weeks.  If you are age 63 or older, your body mass index should be between 23-30. Your Body mass index is 21.48 kg/m. If this is out of the aforementioned range listed, please consider follow up with your Primary Care Provider.  If you are age 48 or younger, your body mass index should be between 19-25. Your Body mass index is 21.48 kg/m. If this is out of the aformentioned range listed, please consider follow up with your Primary Care Provider.   __________________________________________________________  The [facility] providers would like to encourage you to use [facility] to communicate with providers for non-urgent requests or questions.  Due to long hold times on the telephone, sending your provider a message by [facility] may be a faster and more efficient way to get a response.  Please allow 48 business hours for a response.  Please remember that this is for non-urgent requests.

## 2020-11-09 NOTE — Progress Notes (Signed)
Reviewed and agree with documentation and assessment and plan. K. Veena Thaddius Manes , MD   

## 2020-11-09 NOTE — Progress Notes (Signed)
Chief Complaint: Globus sensation  HPI:    Beverly Lowe is a 75 year old female with a past medical history as listed below, assigned a Dr. Lavon Paganini at last visit, who presents to clinic today for a complaint of globus sensation.    10/2017 colon[place] in [place] with 2 polyps.  Pathology showed focal reactive lymphoid aggregates.  Also a few diverticula in the left colon, moderate size internal hemorrhoids and tortuous colon.  Described family history of colon cancer in her father and brother.    02/24/2020 patient seen in clinic for right lower quadrant abdominal pain and constipation.  She had had a CT which showed retained stool in the right colon and transverse colon.  She was encouraged to use a full dose of MiraLAX daily and reduce to half a dose daily she felt like that was too much.  She was placed in recall for colonoscopy in September 2024.    03/02/2020 normal CBC and BMP.    06/29/2020 patient seen in clinic by her PCP for dysphagia.  At that time described dysphagia with eggs and liquid.  She was referred to [place].    Today, the patient tells me that she is not really having problems with food or liquids getting stuck in her throat but has a constant feeling like there is a "glob of mucus" in there.  Tells me that she does have some nasal drainage and was actually started on a nasal spray, Atrovent and this has helped a little bit with that.  Tells me that when laying down at night she also tends to cough more.  This has been going on for the past few months and she just wants to make sure there is nothing else happening.  Describes only very occasional reflux related to certain foods.  Associated symptoms include some hoarseness.    Discusses that she sees ENT for some hearing loss in her left ear.  She has never asked them about the symptoms.    Denies fever, chills, weight loss, blood in her stool, abdominal pain or symptoms that awaken her from sleep.  Past Medical History:  Diagnosis  Date   Anxiety    did not like the way ssri made her feel ? name   Colon polyps    Hemorrhoids    Hemorrhoids    Herpes    oral   Shingles    right back/flank remotely lasting 9 months of pain was on gabapentin   Tinnitus     Past Surgical History:  Procedure Laterality Date   COLONOSCOPY     dental implants      Current Outpatient Medications  Medication Sig Dispense Refill   ALPRAZolam (XANAX) 0.25 MG tablet Take 0.5 tablets (0.125 mg total) by mouth daily as needed for anxiety. 15 tablet 5   Ascorbic Acid (VITAMIN C PO) Take by mouth.     CALCIUM PO Take by mouth.     hydrocortisone (ANUSOL-HC) 2.5 % rectal cream APPLY RECTALLY TO THE AFFECTED AREA TWICE DAILY AS NEEDED 30 g 11   ipratropium (ATROVENT) 0.03 % nasal spray Place 2 sprays into both nostrils 3 (three) times daily as needed for rhinitis. 30 mL 5   MAGNESIUM PO Take by mouth.     Multiple Vitamins-Minerals (ZINC PO) Take by mouth.     polyethylene glycol (MIRALAX / GLYCOLAX) 17 g packet Take 17 g by mouth as needed.     valACYclovir (VALTREX) 1000 MG tablet Take 2 tablets (2,000 mg total)  by mouth 2 (two) times daily as needed. X 1 day outbreak prn (Patient not taking: Reported on 06/29/2020) 30 tablet 11   VITAMIN D PO Take by mouth.     No current facility-administered medications for this visit.    Allergies as of 11/09/2020 - Review Complete 10/21/2020  Allergen Reaction Noted   Gluten meal Other (See Comments) 03/02/2020    Family History  Problem Relation Age of Onset   Cancer Mother        breast later in 46s and uterine   Breast cancer Mother        late 68's   Osteoporosis Mother    Uterine cancer Mother    Cancer Father        colon   Colon cancer Father    Diabetes Father    Heart disease Father    Atrial fibrillation Father    Cancer Brother        colon   Colon cancer Brother    Breast cancer Maternal Aunt    Osteoporosis Maternal Grandmother    Atrial fibrillation Nephew      Social History   Socioeconomic History   Marital status: Married    Spouse name: Not on file   Number of children: Not on file   Years of education: Not on file   Highest education level: Not on file  Occupational History   Not on file  Tobacco Use   Smoking status: Never   Smokeless tobacco: Never  Vaping Use   Vaping Use: Never used  Substance and Sexual Activity   Alcohol use: Yes    Comment: socially   Drug use: Never   Sexual activity: Yes  Other Topics Concern   Not on file  Social History Narrative   Moved from [place] in 3 or 05/2018 to be near son and grandkids    Never smoker    Exercise yes      DPR Husband Beverly Lowe [phone]   Social Determinants of Health   Financial Resource Strain: Low Risk    Difficulty of Paying Living Expenses: Not hard at all  Food Insecurity: No Food Insecurity   Worried About Programme researcher, broadcasting/film/video in the Last Year: Never true   Ran Out of Food in the Last Year: Never true  Transportation Needs: No Transportation Needs   Lack of Transportation (Medical): No   Lack of Transportation (Non-Medical): No  Physical Activity: Sufficiently Active   Days of Exercise per Week: 5 days   Minutes of Exercise per Session: 30 min  Stress: No Stress Concern Present   Feeling of Stress : Not at all  Social Connections: Unknown   Frequency of Communication with Friends and Family: Not on file   Frequency of Social Gatherings with Friends and Family: More than three times a week   Attends Religious Services: Not on Scientist, clinical (histocompatibility and immunogenetics) or Organizations: Not on file   Attends Banker Meetings: Not on file   Marital Status: Not on file  Intimate Partner Violence: Not At Risk   Fear of Current or Ex-Partner: No   Emotionally Abused: No   Physically Abused: No   Sexually Abused: No    Review of Systems:    Constitutional: No weight loss, fever or chills Skin: No rash Cardiovascular: No chest  pain Respiratory: No SOB  Gastrointestinal: See HPI and otherwise negative   Physical Exam:  Vital signs: BP 120/70   Pulse  72   Ht 5' (1.524 m)   Wt 110 lb (49.9 kg)   BMI 21.48 kg/m    Constitutional:   Pleasant Elderly Caucasian female appears to be in NAD, Well developed, Well nourished, alert and cooperative Respiratory: Respirations even and unlabored. Lungs clear to auscultation bilaterally.   No wheezes, crackles, or rhonchi.  Cardiovascular: Normal S1, S2. No MRG. Regular rate and rhythm. No peripheral edema, cyanosis or pallor.  Gastrointestinal:  Soft, nondistended, nontender. No rebound or guarding. Normal bowel sounds. No appreciable masses or hepatomegaly. Psychiatric: Demonstrates good judgement and reason without abnormal affect or behaviors.  RELEVANT LABS AND IMAGING: CBC    Component Value Date/Time   WBC 5.6 03/02/2020 1122   RBC 4.03 03/02/2020 1122   HGB 13.0 03/02/2020 1122   HCT 38.2 03/02/2020 1122   PLT 186.0 03/02/2020 1122   MCV 94.9 03/02/2020 1122   MCHC 34.0 03/02/2020 1122   RDW 12.7 03/02/2020 1122   LYMPHSABS 2.0 03/02/2020 1122   MONOABS 0.4 03/02/2020 1122   EOSABS 0.1 03/02/2020 1122   BASOSABS 0.0 03/02/2020 1122    CMP     Component Value Date/Time   NA 141 03/02/2020 1122   K 3.9 03/02/2020 1122   CL 104 03/02/2020 1122   CO2 30 03/02/2020 1122   GLUCOSE 88 03/02/2020 1122   BUN 12 03/02/2020 1122   CREATININE 0.67 03/02/2020 1122   CALCIUM 9.9 03/02/2020 1122   PROT 6.5 11/09/2019 0940   ALBUMIN 4.2 11/09/2019 0940   AST 15 11/09/2019 0940   ALT 14 11/09/2019 0940   ALKPHOS 71 11/09/2019 0940   BILITOT 0.5 11/09/2019 0940    Assessment: 1.  Globus sensation: Describes a feeling of a glob of mucus or something else in her throat which is almost constantly there, also relates some hoarseness and a cough at night, does admits to postnasal drainage; consider postnasal drainage/sinus etiology versus reflux  Plan: 1.   Scheduled patient for barium esophagram with tablet for further investigation of symptoms. 2.  Recommend the patient buy over-the-counter Prilosec 20 mg.  She should take this once daily in the morning for the next 2 weeks.  If she notes benefit then she can let us know. 3.  Would recommend that if barium swallow is normal she follow with her ENT to see if this is related to sinuses/nasal drainage.  Pending what they decide, next step in work-up would be an EGD. 4.  Patient to follow in clinic per recommendations after imaging above.  Hyacinth Meeker, PA-C [facility] 11/09/2020, 11:01 AM  Cc: [name], [name] *

## 2020-12-19 ENCOUNTER — Other Ambulatory Visit: Payer: Self-pay | Admitting: Internal Medicine

## 2020-12-19 DIAGNOSIS — R0982 Postnasal drip: Secondary | ICD-10-CM

## 2021-01-03 ENCOUNTER — Other Ambulatory Visit: Payer: Self-pay

## 2021-01-03 ENCOUNTER — Encounter: Payer: Self-pay | Admitting: Internal Medicine

## 2021-01-03 ENCOUNTER — Ambulatory Visit (INDEPENDENT_AMBULATORY_CARE_PROVIDER_SITE_OTHER): Payer: Medicare Other | Admitting: Internal Medicine

## 2021-01-03 VITALS — BP 134/76 | HR 66 | Temp 97.6°F | Ht 60.0 in | Wt 109.2 lb

## 2021-01-03 DIAGNOSIS — H9319 Tinnitus, unspecified ear: Secondary | ICD-10-CM

## 2021-01-03 DIAGNOSIS — Z1231 Encounter for screening mammogram for malignant neoplasm of breast: Secondary | ICD-10-CM

## 2021-01-03 DIAGNOSIS — Z1329 Encounter for screening for other suspected endocrine disorder: Secondary | ICD-10-CM

## 2021-01-03 DIAGNOSIS — H9192 Unspecified hearing loss, left ear: Secondary | ICD-10-CM

## 2021-01-03 DIAGNOSIS — I340 Nonrheumatic mitral (valve) insufficiency: Secondary | ICD-10-CM | POA: Insufficient documentation

## 2021-01-03 DIAGNOSIS — I351 Nonrheumatic aortic (valve) insufficiency: Secondary | ICD-10-CM

## 2021-01-03 DIAGNOSIS — I781 Nevus, non-neoplastic: Secondary | ICD-10-CM

## 2021-01-03 DIAGNOSIS — R0982 Postnasal drip: Secondary | ICD-10-CM

## 2021-01-03 DIAGNOSIS — B009 Herpesviral infection, unspecified: Secondary | ICD-10-CM

## 2021-01-03 DIAGNOSIS — I77819 Aortic ectasia, unspecified site: Secondary | ICD-10-CM

## 2021-01-03 DIAGNOSIS — E785 Hyperlipidemia, unspecified: Secondary | ICD-10-CM

## 2021-01-03 DIAGNOSIS — F419 Anxiety disorder, unspecified: Secondary | ICD-10-CM

## 2021-01-03 DIAGNOSIS — Z23 Encounter for immunization: Secondary | ICD-10-CM | POA: Diagnosis not present

## 2021-01-03 DIAGNOSIS — Z1283 Encounter for screening for malignant neoplasm of skin: Secondary | ICD-10-CM

## 2021-01-03 MED ORDER — TETANUS-DIPHTH-ACELL PERTUSSIS 5-2.5-18.5 LF-MCG/0.5 IM SUSP: 0.5000 mL | Freq: Once | INTRAMUSCULAR | 0 refills | Status: AC

## 2021-01-03 MED ORDER — SHINGRIX 50 MCG/0.5ML IM SUSR
0.5000 mL | Freq: Once | INTRAMUSCULAR | 0 refills | Status: AC
Start: 2021-01-03 — End: 2021-01-03

## 2021-01-03 MED ORDER — VALACYCLOVIR HCL 1 G PO TABS: 2000.0000 mg | ORAL_TABLET | Freq: Two times a day (BID) | ORAL | 11 refills | Status: DC | PRN

## 2021-01-03 NOTE — Patient Instructions (Addendum)
Consider booster pfizer bivalent 4-6 months  Tdap vaccine 01/2021 and shingrix start 02/2020 and then in 2-6 months  Consider allegra, xyzal, claritin or zyrtec 1/2 pill at night  Call ENT for post nasal drip  Postnasal Drip Postnasal drip is the feeling of mucus going down the back of your throat. Mucus is a slimy substance that moistens and cleans your nose and throat, as well as the air pockets in face bones near your forehead and cheeks (sinuses). Small amounts of mucus pass from your nose and sinuses down the back of your throat all the time. This is normal. When you produce too much mucus or the mucus gets too thick, you can feel it. Some common causes of postnasal drip include: Having more mucus because of: A cold or the flu. Allergies. Cold air. Certain medicines. Having more mucus that is thicker because of: A sinus or nasal infection. Dry air. A food allergy. Follow these instructions at home: Relieving discomfort  Gargle with a salt-water mixture 3-4 times a day or as needed. To make a salt-water mixture, completely dissolve -1 tsp of salt in 1 cup of warm water. If the air in your home is dry, use a humidifier to add moisture to the air. Use a saline spray or container (neti pot) to flush out the nose (nasal irrigation). These methods can help clear away mucus and keep the nasal passages moist. General instructions Take over-the-counter and prescription medicines only as told by your health care provider. Follow instructions from your health care provider about eating or drinking restrictions. You may need to avoid caffeine. Avoid things that you know you are allergic to (allergens), like dust, mold, pollen, pets, or certain foods. Drink enough fluid to keep your urine pale yellow. Keep all follow-up visits as told by your health care provider. This is important. Contact a health care provider if: You have a fever. You have a sore throat. You have difficulty  swallowing. You have headache. You have sinus pain. You have a cough that does not go away. The mucus from your nose becomes thick and is green or yellow in color. You have cold or flu symptoms that last more than 10 days. Summary Postnasal drip is the feeling of mucus going down the back of your throat. If your health care provider approves, use nasal irrigation or a nasal spray 2?4 times a day. Avoid things that you know you are allergic to (allergens), like dust, mold, pollen, pets, or certain foods. This information is not intended to replace advice given to you by your health care provider. Make sure you discuss any questions you have with your health care provider. Document Revised: 11/03/2019 Document Reviewed: 11/03/2019 Elsevier Patient Education  2022 Elsevier Inc.   Tdap (Tetanus, Diphtheria, Pertussis) Vaccine: What You Need to Know 1. Why get vaccinated? Tdap vaccine can prevent tetanus, diphtheria, and pertussis. Diphtheria and pertussis spread from person to person. Tetanus enters the body through cuts or wounds. TETANUS (T) causes painful stiffening of the muscles. Tetanus can lead to serious health problems, including being unable to open the mouth, having trouble swallowing and breathing, or death. DIPHTHERIA (D) can lead to difficulty breathing, heart failure, paralysis, or death. PERTUSSIS (aP), also known as "whooping cough," can cause uncontrollable, violent coughing that makes it hard to breathe, eat, or drink. Pertussis can be extremely serious especially in babies and young children, causing pneumonia, convulsions, brain damage, or death. In teens and adults, it can cause weight loss, loss  of bladder control, passing out, and rib fractures from severe coughing. 2. Tdap vaccine Tdap is only for children 7 years and older, adolescents, and adults.  Adolescents should receive a single dose of Tdap, preferably at age 63 or 12 years. Pregnant people should get a dose of  Tdap during every pregnancy, preferably during the early part of the third trimester, to help protect the newborn from pertussis. Infants are most at risk for severe, life-threatening complications from pertussis. Adults who have never received Tdap should get a dose of Tdap. Also, adults should receive a booster dose of either Tdap or Td (a different vaccine that protects against tetanus and diphtheria but not pertussis) every 10 years, or after 5 years in the case of a severe or dirty wound or burn. Tdap may be given at the same time as other vaccines. 3. Talk with your health care provider Tell your vaccine provider if the person getting the vaccine: Has had an allergic reaction after a previous dose of any vaccine that protects against tetanus, diphtheria, or pertussis, or has any severe, life-threatening allergies Has had a coma, decreased level of consciousness, or prolonged seizures within 7 days after a previous dose of any pertussis vaccine (DTP, DTaP, or Tdap) Has seizures or another nervous system problem Has ever had Guillain-Barr Syndrome (also called "GBS") Has had severe pain or swelling after a previous dose of any vaccine that protects against tetanus or diphtheria In some cases, your health care provider may decide to postpone Tdap vaccination until a future visit. People with minor illnesses, such as a cold, may be vaccinated. People who are moderately or severely ill should usually wait until they recover before getting Tdap vaccine.  Your health care provider can give you more information. 4. Risks of a vaccine reaction Pain, redness, or swelling where the shot was given, mild fever, headache, feeling tired, and nausea, vomiting, diarrhea, or stomachache sometimes happen after Tdap vaccination. People sometimes faint after medical procedures, including vaccination. Tell your provider if you feel dizzy or have vision changes or ringing in the ears.  As with any medicine, there  is a very remote chance of a vaccine causing a severe allergic reaction, other serious injury, or death. 5. What if there is a serious problem? An allergic reaction could occur after the vaccinated person leaves the clinic. If you see signs of a severe allergic reaction (hives, swelling of the face and throat, difficulty breathing, a fast heartbeat, dizziness, or weakness), call 9-1-1 and get the person to the nearest hospital. For other signs that concern you, call your health care provider.  Adverse reactions should be reported to the Vaccine Adverse Event Reporting System (VAERS). Your health care provider will usually file this report, or you can do it yourself. Visit the VAERS website at www.vaers.LAgents.no or call (519) 800-8299. VAERS is only for reporting reactions, and VAERS staff members do not give medical advice. 6. The National Vaccine Injury Compensation Program The [employer] Vaccine Injury Compensation Program (VICP) is a federal program that was created to compensate people who may have been injured by certain vaccines. Claims regarding alleged injury or death due to vaccination have a time limit for filing, which may be as short as two years. Visit the VICP website at SpiritualWord.at or call (813)815-4155 to learn about the program and about filing a claim. 7. How can I learn more? Ask your health care provider. Call your local or state health department. Visit the website of the Food and  Drug Administration (FDA) for vaccine package inserts and additional information at FinderList.no. Contact the Centers for Disease Control and Prevention (CDC): Call 216-307-3625 (1-800-CDC-INFO) or Visit CDC's website at PicCapture.uy. Vaccine Information Statement Tdap (Tetanus, Diphtheria, Pertussis) Vaccine (09/11/2019) This information is not intended to replace advice given to you by your health care provider. Make sure you discuss any  questions you have with your health care provider. Document Revised: 10/07/2019 Document Reviewed: 10/07/2019 Elsevier Patient Education  2022 Elsevier Inc.   Zoster Vaccine, Recombinant injection What is this medication? ZOSTER VACCINE (ZOS ter vak SEEN) is a vaccine used to reduce the risk of getting shingles. This vaccine is not used to treat shingles or nerve pain from shingles. This medicine may be used for other purposes; ask your health care provider or pharmacist if you have questions. COMMON BRAND NAME(S): [facility] What should I tell my care team before I take this medication? They need to know if you have any of these conditions: cancer immune system problems an unusual or allergic reaction to Zoster vaccine, other medications, foods, dyes, or preservatives pregnant or trying to get pregnant breast-feeding How should I use this medication? This vaccine is injected into a muscle. It is given by a health care provider. A copy of Vaccine Information Statements will be given before each vaccination. Be sure to read this information carefully each time. This sheet may change often. Talk to your health care provider about the use of this vaccine in children. This vaccine is not approved for use in children. Overdosage: If you think you have taken too much of this medicine contact a poison control center or emergency room at once. NOTE: This medicine is only for you. Do not share this medicine with others. What if I miss a dose? Keep appointments for follow-up (booster) doses. It is important not to miss your dose. Call your health care provider if you are unable to keep an appointment. What may interact with this medication? medicines that suppress your immune system medicines to treat cancer steroid medicines like prednisone or cortisone This list may not describe all possible interactions. Give your health care provider a list of all the medicines, herbs, non-prescription drugs,  or dietary supplements you use. Also tell them if you smoke, drink alcohol, or use illegal drugs. Some items may interact with your medicine. What should I watch for while using this medication? Visit your health care provider regularly. This vaccine, like all vaccines, may not fully protect everyone. What side effects may I notice from receiving this medication? Side effects that you should report to your doctor or health care professional as soon as possible: allergic reactions (skin rash, itching or hives; swelling of the face, lips, or tongue) trouble breathing Side effects that usually do not require medical attention (report these to your doctor or health care professional if they continue or are bothersome): chills headache fever nausea pain, redness, or irritation at site where injected tiredness vomiting This list may not describe all possible side effects. Call your doctor for medical advice about side effects. You may report side effects to FDA at 1-800-FDA-1088. Where should I keep my medication? This vaccine is only given by a health care provider. It will not be stored at home. NOTE: This sheet is a summary. It may not cover all possible information. If you have questions about this medicine, talk to your doctor, pharmacist, or health care provider.  2022 Elsevier/Gold Standard (2020-10-11 00:00:00)

## 2021-01-03 NOTE — Progress Notes (Signed)
Chief Complaint  Patient presents with   Follow-up   F/u  1. PND used atrovent nasal caused phelgm to build up in throat will f/u ENT  Cc'ed GI as they wanted her to have Barium swallow   2. Tinnitis uses prn xanax 0.25 to help and c/o hearing loss left ear will f/u eNT 3. Valve d/o pending echo 03/2021 wants female tech sch at cardiology messaged them  ROS Past Medical History:  Diagnosis Date   Anxiety    did not like the way ssri made her feel ? name   Colon polyps    Hemorrhoids    Hemorrhoids    Herpes    oral   Shingles    right back/flank remotely lasting 9 months of pain was on gabapentin   Tinnitus    Past Surgical History:  Procedure Laterality Date   COLONOSCOPY     dental implants     Family History  Problem Relation Age of Onset   Cancer Mother        breast later in 65s and uterine   Osteoporosis Mother    Colon cancer Father        Dx between age 75-60   Diabetes Father    Heart disease Father    Atrial fibrillation Father    Cancer Brother        colon dx in 15s   Colon cancer Brother        Dx in his 5-50   Osteoporosis Maternal Grandmother    Breast cancer Maternal Aunt    Atrial fibrillation Nephew    Social History   Socioeconomic History   Marital status: Married    Spouse name: Not on file   Number of children: 2   Years of education: Not on file   Highest education level: Not on file  Occupational History   Not on file  Tobacco Use   Smoking status: Never   Smokeless tobacco: Never  Vaping Use   Vaping Use: Never used  Substance and Sexual Activity   Alcohol use: Yes    Comment: socially   Drug use: Never   Sexual activity: Yes  Other Topics Concern   Not on file  Social History Narrative   Moved from [place] in 3 or 05/2018 to be near son and grandkids    Never smoker    Exercise yes      DPR Husband Beverly Lowe [phone]   Social Determinants of Health   Financial Resource Strain: Low Risk    Difficulty of  Paying Living Expenses: Not hard at all  Food Insecurity: No Food Insecurity   Worried About Programme researcher, broadcasting/film/video in the Last Year: Never true   Ran Out of Food in the Last Year: Never true  Transportation Needs: No Transportation Needs   Lack of Transportation (Medical): No   Lack of Transportation (Non-Medical): No  Physical Activity: Sufficiently Active   Days of Exercise per Week: 5 days   Minutes of Exercise per Session: 30 min  Stress: No Stress Concern Present   Feeling of Stress : Not at all  Social Connections: Unknown   Frequency of Communication with Friends and Family: Not on file   Frequency of Social Gatherings with Friends and Family: More than three times a week   Attends Religious Services: Not on file   Active Member of Clubs or Organizations: Not on file   Attends Banker Meetings: Not on file  Marital Status: Not on file  Intimate Partner Violence: Not At Risk   Fear of Current or Ex-Partner: No   Emotionally Abused: No   Physically Abused: No   Sexually Abused: No   Current Meds  Medication Sig   ALPRAZolam (XANAX) 0.25 MG tablet Take 0.5 tablets (0.125 mg total) by mouth daily as needed for anxiety.   Ascorbic Acid (VITAMIN C PO) Take by mouth.   CALCIUM PO Take by mouth.   hydrocortisone (ANUSOL-HC) 2.5 % rectal cream APPLY RECTALLY TO THE AFFECTED AREA TWICE DAILY AS NEEDED   ipratropium (ATROVENT) 0.03 % nasal spray USE 2 SPRAYS IN EACH NOSTRIL THREE TIMES DAILY AS NEEDED FOR RHINITIS   MAGNESIUM PO Take by mouth.   Multiple Vitamins-Minerals (ZINC PO) Take by mouth.   polyethylene glycol (MIRALAX / GLYCOLAX) 17 g packet Take 17 g by mouth as needed.   Tdap (BOOSTRIX) 5-2.5-18.5 LF-MCG/0.5 injection Inject 0.5 mLs into the muscle once for 1 dose.   VITAMIN D PO Take by mouth.   Zoster Vaccine Adjuvanted [facility]) injection Inject 0.5 mLs into the muscle once for 1 dose. X 2 doses   [DISCONTINUED] valACYclovir (VALTREX) 1000 MG tablet Take 2  tablets (2,000 mg total) by mouth 2 (two) times daily as needed. X 1 day outbreak prn   Allergies  Allergen Reactions   Soy Allergy Other (See Comments)    Bloating , cramps   Gluten Meal Other (See Comments)    Bloating and abdominal cramps   No results found for this or any previous visit (from the past 2160 hour(s)). Objective  Body mass index is 21.33 kg/m. Wt Readings from Last 3 Encounters:  01/03/21 109 lb 3.2 oz (49.5 kg)  11/09/20 110 lb (49.9 kg)  10/21/20 108 lb (49 kg)   Temp Readings from Last 3 Encounters:  01/03/21 97.6 F (36.4 C) (Temporal)  06/29/20 97.6 F (36.4 C) (Oral)  03/02/20 (!) 97.5 F (36.4 C) (Oral)   BP Readings from Last 3 Encounters:  01/03/21 134/76  11/09/20 120/70  06/29/20 126/78   Pulse Readings from Last 3 Encounters:  01/03/21 66  11/09/20 72  06/29/20 66    Physical Exam  Assessment  Plan  Post-nasal drip Consider ENT f/u established   Spider veins - Plan: Ambulatory referral to Dermatology Skin cancer screening - Plan: Ambulatory referral to Dermatology  Herpes - Plan: valACYclovir (VALTREX) 1000 MG tablet  Hearing loss of left ear, unspecified hearing loss type consider f/u ent   HM Flu shot utd given  Never had prevnar or pna 23 (declines pna vaccines) Rx shingrix had zostervax, Tdap been covid had 4/4 consider 5th dose    Pap out of age window  Mammogram 11/2019 ordered had 11/2018, 02/09/20 neg ordered  DEXA 11/2018 +osteoporosis T score -4.1  -declines prolia or meds to tx 11/05/19    Colonoscopy had in 2019 FH brother and dad, brother colon cancer due in 5 years 10/26/2022 h/o polyps personally   mod IH, divert.tortuous colon,x2 polpys Dr. Elwyn Lade 10/25/17    Skin: currently no issues h/o cysts removal no skin cancer    Supplements zinc, calcium, vitamin D   Provider: Dr. [name] [name]-Internal Medicine

## 2021-01-04 ENCOUNTER — Telehealth: Payer: Self-pay

## 2021-01-04 NOTE — Telephone Encounter (Signed)
Barium esophagram was ordered on 11/09/20. Called and spoke with patient. I have provided her the phone number to radiology scheduling. She will contact them to set up her appt. Pt verbalized understanding and had no concerns at the end of the call.

## 2021-01-04 NOTE — Progress Notes (Signed)
Lmov to move appt to 2-16 room 2

## 2021-01-04 NOTE — Telephone Encounter (Signed)
-----   Message from [facility] [place], [place] sent at 01/03/2021 12:24 PM EST ----- Regarding: barium swallow Can you please set up patient for barium esophagram with tablet.  Thanks, JL L ----- Message ----- From: [name], Pasty Spillers, MD Sent: 01/03/2021  12:16 PM EST To: Unk Lightning, PA  Pt never heard about barium swallow can you coordinate and reach out to patient

## 2021-01-17 ENCOUNTER — Other Ambulatory Visit (INDEPENDENT_AMBULATORY_CARE_PROVIDER_SITE_OTHER): Payer: Medicare Other

## 2021-01-17 ENCOUNTER — Other Ambulatory Visit: Payer: Self-pay

## 2021-01-17 DIAGNOSIS — I781 Nevus, non-neoplastic: Secondary | ICD-10-CM | POA: Diagnosis not present

## 2021-01-17 DIAGNOSIS — R0982 Postnasal drip: Secondary | ICD-10-CM

## 2021-01-17 DIAGNOSIS — B009 Herpesviral infection, unspecified: Secondary | ICD-10-CM

## 2021-01-17 DIAGNOSIS — E785 Hyperlipidemia, unspecified: Secondary | ICD-10-CM

## 2021-01-17 DIAGNOSIS — Z1329 Encounter for screening for other suspected endocrine disorder: Secondary | ICD-10-CM

## 2021-01-17 DIAGNOSIS — F419 Anxiety disorder, unspecified: Secondary | ICD-10-CM | POA: Diagnosis not present

## 2021-01-17 DIAGNOSIS — H9319 Tinnitus, unspecified ear: Secondary | ICD-10-CM

## 2021-01-17 LAB — COMPREHENSIVE METABOLIC PANEL
ALT: 11 U/L (ref 0–35)
AST: 13 U/L (ref 0–37)
Albumin: 4.2 g/dL (ref 3.5–5.2)
Alkaline Phosphatase: 78 U/L (ref 39–117)
BUN: 11 mg/dL (ref 6–23)
CO2: 29 mEq/L (ref 19–32)
Calcium: 9.7 mg/dL (ref 8.4–10.5)
Chloride: 104 mEq/L (ref 96–112)
Creatinine, Ser: 0.69 mg/dL (ref 0.40–1.20)
GFR: 84.07 mL/min (ref >60.00)
Glucose, Bld: 88 mg/dL (ref 70–99)
Potassium: 3.9 mEq/L (ref 3.5–5.1)
Sodium: 141 mEq/L (ref 135–145)
Total Bilirubin: 0.6 mg/dL (ref 0.2–1.2)
Total Protein: 6.4 g/dL (ref 6.0–8.3)

## 2021-01-17 LAB — CBC WITH DIFFERENTIAL/PLATELET
Basophils Absolute: 0 10*3/uL (ref 0.0–0.1)
Basophils Relative: 0.5 % (ref 0.0–3.0)
Eosinophils Absolute: 0.1 10*3/uL (ref 0.0–0.7)
Eosinophils Relative: 2 % (ref 0.0–5.0)
HCT: 40.1 % (ref 36.0–46.0)
Hemoglobin: 13.4 g/dL (ref 12.0–15.0)
Lymphocytes Relative: 32.9 % (ref 12.0–46.0)
Lymphs Abs: 1.8 10*3/uL (ref 0.7–4.0)
MCHC: 33.4 g/dL (ref 30.0–36.0)
MCV: 95 fl (ref 78.0–100.0)
Monocytes Absolute: 0.3 10*3/uL (ref 0.1–1.0)
Monocytes Relative: 6.4 % (ref 3.0–12.0)
Neutro Abs: 3.1 10*3/uL (ref 1.4–7.7)
Neutrophils Relative %: 58.2 % (ref 43.0–77.0)
Platelets: 199 10*3/uL (ref 150.0–400.0)
RBC: 4.22 Mil/uL (ref 3.87–5.11)
RDW: 13.4 % (ref 11.5–15.5)
WBC: 5.4 10*3/uL (ref 4.0–10.5)

## 2021-01-17 LAB — TSH: TSH: 1.78 u[IU]/mL (ref 0.35–5.50)

## 2021-01-17 LAB — LIPID PANEL
Cholesterol: 207 mg/dL — ABNORMAL HIGH (ref 0–200)
HDL: 74.6 mg/dL (ref >39.00)
LDL Cholesterol: 111 mg/dL — ABNORMAL HIGH (ref 0–99)
NonHDL: 132.44
Total CHOL/HDL Ratio: 3
Triglycerides: 108 mg/dL (ref 0.0–149.0)
VLDL: 21.6 mg/dL (ref 0.0–40.0)

## 2021-01-18 ENCOUNTER — Telehealth (INDEPENDENT_AMBULATORY_CARE_PROVIDER_SITE_OTHER): Payer: Medicare Other | Admitting: Family

## 2021-01-18 ENCOUNTER — Encounter: Payer: Self-pay | Admitting: Family

## 2021-01-18 ENCOUNTER — Telehealth: Payer: Self-pay | Admitting: Family

## 2021-01-18 ENCOUNTER — Telehealth: Payer: Self-pay | Admitting: Internal Medicine

## 2021-01-18 VITALS — BP 132/78 | Ht 60.0 in | Wt 105.0 lb

## 2021-01-18 DIAGNOSIS — U071 COVID-19: Secondary | ICD-10-CM | POA: Diagnosis not present

## 2021-01-18 HISTORY — DX: COVID-19: U07.1

## 2021-01-18 MED ORDER — HYDROCOD POLST-CPM POLST ER 10-8 MG/5ML PO SUER: 5.0000 mL | Freq: Every evening | ORAL | 0 refills | Status: DC | PRN

## 2021-01-18 MED ORDER — MOLNUPIRAVIR EUA 200MG CAPSULE
4.0000 | ORAL_CAPSULE | Freq: Two times a day (BID) | ORAL | 0 refills | Status: AC
Start: 2021-01-18 — End: 2021-01-23

## 2021-01-18 NOTE — Patient Instructions (Signed)
We discussed starting Molnupiravir which is an unapproved drug that is authorized for use under an Emergency Use Authorization.  There are no adequate, approved, available products for the treatment of COVID-19 in adults who have mild-to-moderate COVID-19 and are at high risk for progressing to severe COVID-19, including hospitalization or death.  I have sent  Molnupiravir to your pharmacy. Please call pharmacy so they bring medication out to your car and you do not have to go inside.   This medication is not recommended in pregnancy.    COMMON SIDE EFFECTS: Diarrhea Nausea dizziness   If your COVID-19 symptoms get worse, get medical help right away. Call 911 if you experience symptoms such as worsening cough, trouble breathing, chest pain that doesn't go away, confusion, a hard time staying awake, and pale or blue-colored skin.This medication won't prevent all COVID-19 cases from getting worse.   Molnupiravir Oral Capsules What is this medication? MOLNUPIRAVIR (mol nue pir a vir) treats COVID-19. It is an antiviral medication. It may decrease the risk of developing severe symptoms of COVID-19. It may also decrease the chance of going to the hospital. This medication is not approved by the FDA. The FDA has authorized emergency use of thismedication during the COVID-19 pandemic. This medicine may be used for other purposes; ask your health care provider orpharmacist if you have questions. What should I tell my care team before I take this medication? They need to know if you have any of these conditions: Any allergies Any serious illness An unusual or allergic reaction to molnupiravir, other medications, foods, dyes, or preservatives Pregnant or trying to get pregnant Breast-feeding How should I use this medication? Take this medication by mouth with water. Take it as directed on the prescription label at the same time every day. Do not cut, crush or chew this medication. Swallow the capsules  whole. You can take it with or without food. If it upsets your stomach, take it with food. Take all of this medication unless your care team tells you to stop it early. Keep taking it even if youthink you are better. Talk to your care team about the use of this medication in children. Specialcare may be needed. Overdosage: If you think you have taken too much of this medicine contact apoison control center or emergency room at once. NOTE: This medicine is only for you. Do not share this medicine with others. What if I miss a dose? If you miss a dose, take it as soon as you can unless it is more than 10 hours late. If it is more than 10 hours late, skip the missed dose. Take the next dose at the normal time. Do not take extra or 2 doses at the same time to makeup for the missed dose. What may interact with this medication? Interactions have not been studied. This list may not describe all possible interactions. Give your health care provider a list of all the medicines, herbs, non-prescription drugs, or dietary supplements you use. Also tell them if you smoke, drink alcohol, or use illegaldrugs. Some items may interact with your medicine. What should I watch for while using this medication? Your condition will be monitored carefully while you are receiving this medication. Visit your care team for regular checkups. Tell your care team ifyour symptoms do not start to get better or if they get worse. Do not become pregnant while taking this medication. You may need a pregnancy test before starting this medication. Women must use a reliable form   of birth control while taking this medication and for 4 days after stopping the medication. Women should inform their care team if they wish to become pregnant or think they might be pregnant. Men should not father a child while taking this medication and for 3 months after stopping it. There is potential for serious harm to an unborn child. Talk to your care team for  more information. Do not breast-feed an infant while taking this medication and for 4 days afterstopping the medication. What side effects may I notice from receiving this medication? Side effects that you should report to your care team as soon as possible: Allergic reactions-skin rash, itching, hives, swelling of the face, lips, tongue, or throat Side effects that usually do not require medical attention (report these toyour care team if they continue or are bothersome): Diarrhea Dizziness Nausea This list may not describe all possible side effects. Call your doctor for medical advice about side effects. You may report side effects to FDA at1-800-FDA-1088. Where should I keep my medication? Keep out of the reach of children and pets. Store at room temperature between 20 and 25 degrees C (68 and 77 degrees F).Get rid of any unused medication after the expiration date. To get rid of medications that are no longer needed or have expired: Take the medication to a medication take-back program. Check with your pharmacy or law enforcement to find a location. If you cannot return the medication, check the label or package insert to see if the medication should be thrown out in the garbage or flushed down the toilet. If you are not sure, ask your care team. If it is safe to put it in the trash, take the medication out of the container. Mix the medication with cat litter, dirt, coffee grounds, or other unwanted substance. Seal the mixture in a bag or container. Put it in the trash. NOTE: This sheet is a summary. It may not cover all possible information. If you have questions about this medicine, talk to your doctor, pharmacist, orhealth care provider.  2022 Elsevier/Gold Standard (2020-02-01 16:16:01)     

## 2021-01-18 NOTE — Telephone Encounter (Signed)
Called pt  She requests cough medication  to take at bedtime.  She has been on codeine-based syrups in the past and tolerated  well.  Advised we may start Tussionex to be used at night.  Advised to not take Xanax or use alcohol on this medication.  She verbalizes understanding.  She will let me know if any concerns I looked up patient on  Controlled Substances Reporting System PMP AWARE and saw no activity that raised concern of inappropriate use.

## 2021-01-18 NOTE — Assessment & Plan Note (Signed)
No acute respiratory distress.  Mild symptoms at this time.   Discussed molnupiravir.I have counseled on lacking long term safely and effectiveness data of medication,molnupiravir.  Explained EUA for molnupiravir. Criteria met for consideration of  Molnupiravir :  covid positive, patient older than 76 years old, started within 5 days of symptom onset and risk factor for severe disease include: age > 71, History AVR, osteoporosis  Counseled on adverse effects including nausea, dizziness, diarrhea.  Patient is most comfortable and desires to start [place] .

## 2021-01-18 NOTE — Progress Notes (Signed)
Virtual Visit via Video Note  I connected with@  on 01/18/21 at 12:00 PM EST by a video enabled telemedicine application and verified that I am speaking with the correct person using two identifiers.  Location patient: home Location provider:work  Persons participating in the virtual visit: patient, provider  I discussed the limitations of evaluation and management by telemedicine and the availability of in person appointments. The patient expressed understanding and agreed to proceed.   HPI: Acute visit nasal congestion x 1 day, unchanged.  COVID test this morning at home with positive  Endorses chills Tmax 101 this morning and now 98.2 No sob, cp.   Husband has covid with symptoms started 3 days ago  History AVR, osteoporosis  Nonsmoker  No h/o ckd  Covid vaccinated with booster  ROS: See pertinent positives and negatives per HPI.    EXAM:  VITALS per patient if applicable: BP 324/40    Ht 5' (1.524 m)    Wt 105 lb (47.6 kg)    BMI 20.51 kg/m  BP Readings from Last 3 Encounters:  01/18/21 132/78  01/03/21 134/76  11/09/20 120/70   Wt Readings from Last 3 Encounters:  01/18/21 105 lb (47.6 kg)  01/03/21 109 lb 3.2 oz (49.5 kg)  11/09/20 110 lb (49.9 kg)    GENERAL: alert, oriented, appears well and in no acute distress  HEENT: atraumatic, conjunttiva clear, no obvious abnormalities on inspection of external nose and ears  NECK: normal movements of the head and neck  LUNGS: on inspection no signs of respiratory distress, breathing rate appears normal, no obvious gross SOB, gasping or wheezing  CV: no obvious cyanosis  MS: moves all visible extremities without noticeable abnormality  PSYCH/NEURO: pleasant and cooperative, no obvious depression or anxiety, speech and thought processing grossly intact  ASSESSMENT AND PLAN:  Discussed the following assessment and plan:  Problem List Items Addressed This Visit       Other   COVID-19 - Primary    No  acute respiratory distress.  Mild symptoms at this time.   Discussed molnupiravir.I have counseled on lacking long term safely and effectiveness data of medication,molnupiravir.  Explained EUA for molnupiravir. Criteria met for consideration of  Molnupiravir :  covid positive, patient older than 76 years old, started within 5 days of symptom onset and risk factor for severe disease include: age > 35, History AVR, osteoporosis  Counseled on adverse effects including nausea, dizziness, diarrhea.  Patient is most comfortable and desires to start [place] .        Relevant Medications   molnupiravir EUA (LAGEVRIO) 200 mg CAPS capsule    -we discussed possible serious and likely etiologies, options for evaluation and workup, limitations of telemedicine visit vs in person visit, treatment, treatment risks and precautions. Pt prefers to treat via telemedicine empirically rather then risking or undertaking an in person visit at this moment.  .   I discussed the assessment and treatment plan with the patient. The patient was provided an opportunity to ask questions and all were answered. The patient agreed with the plan and demonstrated an understanding of the instructions.   The patient was advised to call back or seek an in-person evaluation if the symptoms worsen or if the condition fails to improve as anticipated.   Mable Paris, FNP

## 2021-01-23 ENCOUNTER — Encounter: Payer: Self-pay | Admitting: Internal Medicine

## 2021-01-27 ENCOUNTER — Encounter: Payer: Self-pay | Admitting: Internal Medicine

## 2021-02-09 ENCOUNTER — Encounter: Payer: Self-pay | Admitting: Internal Medicine

## 2021-02-10 NOTE — Telephone Encounter (Signed)
Please advise on how long to wait between COVID positive and booster shots.

## 2021-02-13 ENCOUNTER — Ambulatory Visit
Admission: RE | Admit: 2021-02-13 | Discharge: 2021-02-13 | Disposition: A | Discharge status: Home / Self Care | Payer: Medicare Other | Source: Ambulatory Visit | Attending: Physician Assistant | Admitting: Physician Assistant

## 2021-02-13 ENCOUNTER — Other Ambulatory Visit: Payer: Self-pay

## 2021-02-13 ENCOUNTER — Other Ambulatory Visit: Payer: Self-pay | Admitting: Physician Assistant

## 2021-02-13 DIAGNOSIS — R0989 Other specified symptoms and signs involving the circulatory and respiratory systems: Secondary | ICD-10-CM | POA: Insufficient documentation

## 2021-02-14 ENCOUNTER — Ambulatory Visit
Admission: RE | Admit: 2021-02-14 | Discharge: 2021-02-14 | Disposition: A | Discharge status: Home / Self Care | Payer: Medicare Other | Source: Ambulatory Visit | Attending: Internal Medicine | Admitting: Internal Medicine

## 2021-02-14 DIAGNOSIS — Z1231 Encounter for screening mammogram for malignant neoplasm of breast: Secondary | ICD-10-CM | POA: Diagnosis present

## 2021-02-24 ENCOUNTER — Other Ambulatory Visit: Payer: Self-pay

## 2021-02-24 ENCOUNTER — Ambulatory Visit (INDEPENDENT_AMBULATORY_CARE_PROVIDER_SITE_OTHER): Payer: Medicare Other

## 2021-02-24 DIAGNOSIS — I77810 Thoracic aortic ectasia: Secondary | ICD-10-CM

## 2021-02-24 DIAGNOSIS — I351 Nonrheumatic aortic (valve) insufficiency: Secondary | ICD-10-CM

## 2021-02-25 ENCOUNTER — Encounter: Payer: Self-pay | Admitting: Cardiology

## 2021-02-26 ENCOUNTER — Encounter: Payer: Self-pay | Admitting: Cardiology

## 2021-02-26 LAB — ECHOCARDIOGRAM COMPLETE
AR max vel: 1.86 cm2
AV Area VTI: 2.3 cm2
AV Area mean vel: 1.92 cm2
AV Mean grad: 4 mmHg
AV Peak grad: 6.6 mmHg
AV Vena cont: 0.4 cm
Ao pk vel: 1.28 m/s
Area-P 1/2: 3.81 cm2
Calc EF: 67.6 %
P 1/2 time: 540 msec
S' Lateral: 3 cm
Single Plane A2C EF: 70.5 %
Single Plane A4C EF: 62.4 %

## 2021-02-27 ENCOUNTER — Telehealth: Payer: Self-pay

## 2021-02-27 NOTE — Telephone Encounter (Signed)
Called patient and gave her a preliminary report of her echo. Informed her that her Echo was unchanged from last years. I also advised that Dr. Garen Lah thought her syncope was vasovagal in nature after last years testing per his OV note from 04/01/20. Patient stated that last week after she had just woken up, she felt dizzy while walking into the kitchen, her ears started ringing, and she felt sweaty and like she was going to pass out. I advised her that this could also be vasovagal in nature. I advised that she make sure that she keep her fluid intake up, and make slow postural changes. I informed her that I would call her back after I receive the official Echo report form Dr. Garen Lah and that might not until the end of the week. Patient was very grateful for the call back, verbalized understanding and agreed with plan.   MyChart messages from patient:  From all that I read, it seems everything was normal. My question is I had this test done since I had a fainting spell last year 2022 and almost to the day I had another fainting spell 2023 almost to the day a year later. Do I need extensive testing? Thank you.  They left a diagnosis message in my chart with no detailed explanation that is leaving me quite confused and upset. I looked up the information but it could be mild to serious. No one called yesterday to let me know what all this means.

## 2021-03-01 ENCOUNTER — Ambulatory Visit (INDEPENDENT_AMBULATORY_CARE_PROVIDER_SITE_OTHER): Payer: Self-pay | Admitting: Dermatology

## 2021-03-01 ENCOUNTER — Encounter: Payer: Self-pay | Admitting: Dermatology

## 2021-03-01 ENCOUNTER — Other Ambulatory Visit: Payer: Self-pay

## 2021-03-01 DIAGNOSIS — L988 Other specified disorders of the skin and subcutaneous tissue: Secondary | ICD-10-CM

## 2021-03-01 DIAGNOSIS — I8390 Asymptomatic varicose veins of unspecified lower extremity: Secondary | ICD-10-CM

## 2021-03-01 NOTE — Patient Instructions (Addendum)

## 2021-03-01 NOTE — Progress Notes (Signed)
° °  New Patient Visit  Subjective  Beverly Lowe is a 76 y.o. female who presents for the following: Facial Elastosis (Patient here today for fillers at jaw line. Patient also reports she is interested in discussing sclerotherapy at lower legs. ).   The following portions of the chart were reviewed this encounter and updated as appropriate:   Tobacco   Allergies   Meds   Problems   Med Hx   Surg Hx   Fam Hx       Review of Systems:  No other skin or systemic complaints except as noted in HPI or Assessment and Plan.  Objective  Well appearing patient in no apparent distress; mood and affect are within normal limits.  A focused examination was performed including face and bilateral lower legs and thighs. Relevant physical exam findings are noted in the Assessment and Plan.  Head - Anterior (Face) Rhytides and volume loss.                                          Assessment & Plan  Elastosis of skin Head - Anterior (Face)  Patient is interested in a jaw line filler to give some lift to lower face Patient is interested in minimal and less invasive treatments. Did discuss threading and tightening treatments like Sofwave. Pt defers at this time.  2 syringes of Voluma XC injected today     Filling material injection - Head - Anterior (Face) Prior to the procedure, the patient's past medical history, allergies and the rare but potential risks and complications were reviewed with the patient and a signed consent was obtained. Pre and post-treatment care was discussed and instructions provided.  Location: pre-auricular and jaw line  Filler Type: Juvederm Voluma  Procedure: The area was prepped thoroughly with Puracyn. After introducing the needle into the desired treatment area, the syringe plunger was drawn back to ensure there was no flash of blood prior to injecting the filler in order to minimize risk of intravascular injection and vascular  occlusion. After injection of the filler, the treated areas were cleansed and iced to reduce swelling. Post-treatment instructions were reviewed with the patient.       Patient tolerated the procedure well. The patient will call with any problems, questions or concerns prior to their next appointment.  Neomia Dear  Lot [id number] Exp 01/24/2022   Varicose Veins/Spider Veins - Cosmetic consultation - Dilated blue, purple or red veins at the lower extremities - Reassured - Smaller vessels can be treated by sclerotherapy (a procedure to inject a medicine into the veins to make them disappear) if desired, but the treatment is not covered by insurance. Larger vessels may be covered if symptomatic and we would refer to vascular surgeon if treatment desired. Patient would like to schedule treatment to affected areas.   Return for schedule sclerotherapy at legs with Dr. Kirtland Bouchard , [facility] with Dr Neale Burly. I, Asher Muir, CMA, am acting as scribe for Darden Dates, MD.  Documentation: I have reviewed the above documentation for accuracy and completeness, and I agree with the above.  Darden Dates, MD

## 2021-03-02 ENCOUNTER — Ambulatory Visit: Payer: Medicare Other | Admitting: Dermatology

## 2021-03-08 NOTE — Telephone Encounter (Signed)
Called patient and relayed the response from Dr. Azucena Cecil as noted below. Patient verbalized understanding, agreed with plan and was grateful for the follow up.  Debbe Odea, MD  Picard-Tagnol[place], Solan Vosler, RN Caller: Unspecified (1 week ago) Patient's symptoms appear vasovagal.  Echocardiogram reviewed by myself, no significant changes from a year ago.  Mild ascending aorta dilatation, mild to moderate mitral and aortic valve regurgitation.  Findings are overall stable from prior echocardiogram.  Symptoms of dizziness appear vasovagal.  Recommend patient follows up with PCP and or ENT for dizzy symptoms.  No additional cardiac testing recommended at this time.

## 2021-03-23 ENCOUNTER — Other Ambulatory Visit: Payer: Medicare Other

## 2021-04-04 ENCOUNTER — Other Ambulatory Visit: Payer: Medicare Other

## 2021-05-01 ENCOUNTER — Other Ambulatory Visit: Payer: Self-pay | Admitting: Family

## 2021-05-01 DIAGNOSIS — F419 Anxiety disorder, unspecified: Secondary | ICD-10-CM

## 2021-06-01 ENCOUNTER — Ambulatory Visit: Payer: Medicare Other | Admitting: Dermatology

## 2021-06-18 ENCOUNTER — Encounter: Payer: Self-pay | Admitting: Internal Medicine

## 2021-07-11 ENCOUNTER — Ambulatory Visit: Payer: Medicare Other | Admitting: Internal Medicine

## 2021-07-11 ENCOUNTER — Encounter: Payer: Self-pay | Admitting: Internal Medicine

## 2021-07-11 ENCOUNTER — Ambulatory Visit (INDEPENDENT_AMBULATORY_CARE_PROVIDER_SITE_OTHER): Payer: Medicare Other | Admitting: Internal Medicine

## 2021-07-11 VITALS — BP 130/80 | HR 69 | Temp 98.3°F | Resp 14 | Ht 60.0 in | Wt 107.6 lb

## 2021-07-11 DIAGNOSIS — Z1231 Encounter for screening mammogram for malignant neoplasm of breast: Secondary | ICD-10-CM

## 2021-07-11 DIAGNOSIS — H9319 Tinnitus, unspecified ear: Secondary | ICD-10-CM

## 2021-07-11 DIAGNOSIS — R194 Change in bowel habit: Secondary | ICD-10-CM | POA: Diagnosis not present

## 2021-07-11 DIAGNOSIS — H9192 Unspecified hearing loss, left ear: Secondary | ICD-10-CM | POA: Diagnosis not present

## 2021-07-11 DIAGNOSIS — Z1389 Encounter for screening for other disorder: Secondary | ICD-10-CM

## 2021-07-11 DIAGNOSIS — J0110 Acute frontal sinusitis, unspecified: Secondary | ICD-10-CM | POA: Diagnosis not present

## 2021-07-11 DIAGNOSIS — Z800 Family history of malignant neoplasm of digestive organs: Secondary | ICD-10-CM | POA: Diagnosis not present

## 2021-07-11 DIAGNOSIS — E785 Hyperlipidemia, unspecified: Secondary | ICD-10-CM

## 2021-07-11 DIAGNOSIS — L309 Dermatitis, unspecified: Secondary | ICD-10-CM

## 2021-07-11 DIAGNOSIS — Z0000 Encounter for general adult medical examination without abnormal findings: Secondary | ICD-10-CM

## 2021-07-11 DIAGNOSIS — R5383 Other fatigue: Secondary | ICD-10-CM

## 2021-07-11 DIAGNOSIS — T148XXA Other injury of unspecified body region, initial encounter: Secondary | ICD-10-CM

## 2021-07-11 DIAGNOSIS — Z1329 Encounter for screening for other suspected endocrine disorder: Secondary | ICD-10-CM

## 2021-07-11 HISTORY — DX: Change in bowel habit: R19.4

## 2021-07-11 MED ORDER — AZITHROMYCIN 250 MG PO TABS: ORAL_TABLET | ORAL | 0 refills | Status: AC

## 2021-07-11 MED ORDER — HYDROCORTISONE 2.5 % EX CREA: TOPICAL_CREAM | Freq: Two times a day (BID) | CUTANEOUS | 0 refills | Status: DC

## 2021-07-11 MED ORDER — MUPIROCIN 2 % EX OINT: 1.0000 "application " | TOPICAL_OINTMENT | Freq: Two times a day (BID) | CUTANEOUS | 0 refills | Status: DC

## 2021-07-11 NOTE — Progress Notes (Signed)
Chief Complaint  Patient presents with   Follow-up    Disc rash rash/bumps that appeared after receiving 1st shingles shot 1 mon ago they are going away. Disc about sinuses, states she bleeds after blowing nose ongoing issue for wks.    F/u  1. Rash to neck and right arm s/p shingrix vaccine 04/13/21 rash was red and raised but going away  2. C/o sinus bleeding and sinus pain on cheeks and yellow green mucous x few days to 1 week nothing tried  3. Laceration from bed sheets in air bnb left elbow area is red tried liquid bandaid   Review of Systems  Constitutional:  Negative for weight loss.  HENT:  Negative for hearing loss.   Eyes:  Negative for blurred vision.  Respiratory:  Negative for shortness of breath.   Cardiovascular:  Negative for chest pain.  Gastrointestinal:  Negative for abdominal pain and blood in stool.  Genitourinary:  Negative for dysuria.  Musculoskeletal:  Negative for falls and joint pain.  Skin:  Positive for itching and rash.  Neurological:  Negative for headaches.  Psychiatric/Behavioral:  Negative for depression.   Past Medical History:  Diagnosis Date   Anxiety    did not like the way ssri made her feel ? name   Colon polyps    Hemorrhoids    Hemorrhoids    Herpes    oral   Shingles    right back/flank remotely lasting 9 months of pain was on gabapentin   Tinnitus    Past Surgical History:  Procedure Laterality Date   COLONOSCOPY     dental implants     Family History  Problem Relation Age of Onset   Cancer Mother        breast later in 69s and uterine   Osteoporosis Mother    Colon cancer Father        Dx between age 56-60   Diabetes Father    Heart disease Father    Atrial fibrillation Father    Cancer Brother        colon dx in 32s   Colon cancer Brother        Dx in his 4-50   Osteoporosis Maternal Grandmother    Breast cancer Maternal Aunt    Atrial fibrillation Nephew    Social History   Socioeconomic History   Marital  status: Married    Spouse name: Not on file   Number of children: 2   Years of education: Not on file   Highest education level: Not on file  Occupational History   Not on file  Tobacco Use   Smoking status: Never   Smokeless tobacco: Never  Vaping Use   Vaping Use: Never used  Substance and Sexual Activity   Alcohol use: Yes    Comment: socially   Drug use: Never   Sexual activity: Yes  Other Topics Concern   Not on file  Social History Narrative   Moved from [place] in 3 or 05/2018 to be near son and grandkids    Never smoker    Exercise yes      DPR Husband Leen Tworek [phone]   Social Determinants of Health   Financial Resource Strain: Low Risk    Difficulty of Paying Living Expenses: Not hard at all  Food Insecurity: No Food Insecurity   Worried About Programme researcher, broadcasting/film/video in the Last Year: Never true   Ran Out of Food in the Last Year: Never  true  Transportation Needs: No Transportation Needs   Lack of Transportation (Medical): No   Lack of Transportation (Non-Medical): No  Physical Activity: Sufficiently Active   Days of Exercise per Week: 5 days   Minutes of Exercise per Session: 30 min  Stress: No Stress Concern Present   Feeling of Stress : Not at all  Social Connections: Unknown   Frequency of Communication with Friends and Family: Not on file   Frequency of Social Gatherings with Friends and Family: More than three times a week   Attends Religious Services: Not on Scientist, clinical (histocompatibility and immunogenetics) or Organizations: Not on file   Attends Banker Meetings: Not on file   Marital Status: Not on file  Intimate Partner Violence: Not At Risk   Fear of Current or Ex-Partner: No   Emotionally Abused: No   Physically Abused: No   Sexually Abused: No   Current Meds  Medication Sig   ALPRAZolam (XANAX) 0.25 MG tablet TAKE 1/2 TABLET(0.125 MG) BY MOUTH DAILY AS NEEDED FOR ANXIETY   Ascorbic Acid (VITAMIN C PO) Take by mouth.   azithromycin  (ZITHROMAX) 250 MG tablet With food Take 2 tablets on day 1, then 1 tablet daily on days 2 through 5   CALCIUM PO Take by mouth.   hydrocortisone (ANUSOL-HC) 2.5 % rectal cream APPLY RECTALLY TO THE AFFECTED AREA TWICE DAILY AS NEEDED   hydrocortisone 2.5 % cream Apply topically 2 (two) times daily. Prn neck and arm   ipratropium (ATROVENT) 0.03 % nasal spray USE 2 SPRAYS IN EACH NOSTRIL THREE TIMES DAILY AS NEEDED FOR RHINITIS   MAGNESIUM PO Take by mouth.   Multiple Vitamins-Minerals (ZINC PO) Take by mouth.   mupirocin ointment (BACTROBAN) 2 % Apply 1 application. topically 2 (two) times daily. Left elbow   polyethylene glycol (MIRALAX / GLYCOLAX) 17 g packet Take 17 g by mouth as needed.   valACYclovir (VALTREX) 1000 MG tablet Take 2 tablets (2,000 mg total) by mouth 2 (two) times daily as needed. X 1 day outbreak prn   VITAMIN D PO Take by mouth.   Allergies  Allergen Reactions   Soy Allergy Other (See Comments)    Bloating , cramps   Gluten Meal Other (See Comments)    Bloating and abdominal cramps   No results found for this or any previous visit (from the past 2160 hour(s)). Objective  Body mass index is 21.01 kg/m. Wt Readings from Last 3 Encounters:  07/11/21 107 lb 9.6 oz (48.8 kg)  01/18/21 105 lb (47.6 kg)  01/03/21 109 lb 3.2 oz (49.5 kg)   Temp Readings from Last 3 Encounters:  07/11/21 98.3 F (36.8 C) (Oral)  01/03/21 97.6 F (36.4 C) (Temporal)  06/29/20 97.6 F (36.4 C) (Oral)   BP Readings from Last 3 Encounters:  07/11/21 130/80  01/18/21 132/78  01/03/21 134/76   Pulse Readings from Last 3 Encounters:  07/11/21 69  01/03/21 66  11/09/20 72    Physical Exam Vitals and nursing note reviewed.  Constitutional:      Appearance: Normal appearance. She is well-developed and well-groomed.  HENT:     Head: Normocephalic and atraumatic.  Eyes:     Conjunctiva/sclera: Conjunctivae normal.     Pupils: Pupils are equal, round, and reactive to light.   Cardiovascular:     Rate and Rhythm: Normal rate and regular rhythm.     Heart sounds: Normal heart sounds. No murmur heard. Pulmonary:  Effort: Pulmonary effort is normal.     Breath sounds: Normal breath sounds.  Abdominal:     General: Abdomen is flat. Bowel sounds are normal.     Tenderness: There is no abdominal tenderness.  Musculoskeletal:        General: No tenderness.  Skin:    General: Skin is warm and dry.  Neurological:     General: No focal deficit present.     Mental Status: She is alert and oriented to person, place, and time. Mental status is at baseline.     Cranial Nerves: Cranial nerves 2-12 are intact.     Motor: Motor function is intact.     Coordination: Coordination is intact.     Gait: Gait is intact.  Psychiatric:        Attention and Perception: Attention and perception normal.        Mood and Affect: Mood and affect normal.        Speech: Speech normal.        Behavior: Behavior normal. Behavior is cooperative.        Thought Content: Thought content normal.        Cognition and Memory: Cognition and memory normal.        Judgment: Judgment normal.    Assessment  Plan  Acute non-recurrent frontal sinusitis - Plan: azithromycin (ZITHROMAX) 250 MG tablet Add NS  Hearing loss of left ear, unspecified hearing loss type Tinnitis  Established with ENT hearing loss from 25-35% left ear for now declines hearing aid  Consider MRI IACS protocol pt will think about this   Bowel habit changes - Plan: Ambulatory referral to Gastroenterology Dr. Tressia MinersNandigham/[facility]  FH: colon cancer - Plan: Ambulatory referral to Gastroenterology  Open wound left elbow- Plan: mupirocin ointment (BACTROBAN) 2 %  Dermatitis to neck- Plan: hydrocortisone 2.5 % cream  Hyperlipidemia, unspecified hyperlipidemia type - Plan: Lipid panel  HM Flu shot utd given  Never had prevnar or pna 23 (declines pna vaccines) Rx shingrix had zostervax, Tdap been covid had 4/4 consider  5th dose in fall 2023   Pap out of age window   Mammogram 11/2019 ordered had 11/2018, 02/09/20 neg ordered, 02/14/21 negative ordered 02/14/22  DEXA 11/2018 +osteoporosis T score -4.1  -declines prolia or meds to tx 11/05/19    Colonoscopy had in 2019 FH brother and dad, brother colon cancer due in 5 years 10/26/2022 h/o polyps personally   mod IH, divert.tortuous colon,x2 polpys Dr. Elwyn LadeSuman Kaur 10/25/17  Referred [facility] prefers woman MD   Skin: currently no issues h/o cysts removal no skin cancer    Supplements zinc, calcium, vitamin D   Provider: Dr. French Anaracy [name]-Internal Medicine

## 2021-07-11 NOTE — Patient Instructions (Addendum)
Add nasal saline over the counter 2 sprays  Consider MRI brain with intra auditory canal protocol  Dr. Saunders Revel, Dr. Randye Lobo cardiology    Dr. Tarri Glenn, Nandigham-call for appt  Phone Fax E-mail Address  [phone] Not available [facility] [place] [phone]     Specialties     Gastroenterology              Tinnitus Tinnitus refers to hearing a sound when there is no actual source for that sound. This is often described as ringing in the ears. However, people with this condition may hear a variety of noises, in one ear or in both ears. The sounds of tinnitus can be soft, loud, or somewhere in between. Tinnitus can last for a few seconds or can be constant for days. It may go away without treatment and come back at various times. When tinnitus is constant or happens often, it can lead to other problems, such as trouble sleeping and trouble concentrating. Almost everyone experiences tinnitus at some point. Tinnitus is not the same as hearing loss. Tinnitus that is long-lasting (chronic) or comes back often (recurs) may require medical attention. What are the causes? The cause of tinnitus is often not known. In some cases, it can result from: Exposure to loud noises from machinery, music, or other sources. An object (foreign body) stuck in the ear. Earwax buildup. Drinking alcohol or caffeine. Taking certain medicines. Age-related hearing loss. It may also be caused by medical conditions such as: Ear or sinus infections. Heart diseases or high blood pressure. Allergies. Mnire's disease. Thyroid problems. Tumors. A weak, bulging blood vessel (aneurysm) near the ear. What increases the risk? The following factors may make you more likely to develop this condition: Exposure to loud noises. Age. Tinnitus is more likely in older individuals. Using alcohol or tobacco. What are the signs or symptoms? The main symptom of tinnitus is hearing a sound  when there is no source for that sound. It may sound like: Buzzing. Sizzling. Ringing. Blowing air. Hissing. Whistling. Other sounds may include: Roaring. Running water. A musical note. Tapping. Humming. Symptoms may affect only one ear (unilateral) or both ears (bilateral). How is this diagnosed? Tinnitus is diagnosed based on your symptoms, your medical history, and a physical exam. Your health care provider may do a thorough hearing test (audiologic exam) if your tinnitus: Is unilateral. Causes hearing difficulties. Lasts 6 months or longer. You may work with a health care provider who specializes in hearing disorders (audiologist). You may be asked questions about your symptoms and how they affect your daily life. You may have other tests done, such as: CT scan. MRI. An imaging test of how blood flows through your blood vessels (angiogram). How is this treated? Treating an underlying medical condition can sometimes make tinnitus go away. If your tinnitus continues, other treatments may include: Therapy and counseling to help you manage the stress of living with tinnitus. Sound generators to mask the tinnitus. These include: Tabletop sound machines that play relaxing sounds to help you fall asleep. Wearable devices that fit in your ear and play sounds or music. Acoustic neural stimulation. This involves using headphones to listen to music that contains an auditory signal. Over time, listening to this signal may change some pathways in your brain and make you less sensitive to tinnitus. This treatment is used for very severe cases when no other treatment is working. Using hearing aids or cochlear implants if your tinnitus is  related to hearing loss. Hearing aids are worn in the outer ear. Cochlear implants are surgically placed in the inner ear. Follow these instructions at home: Managing symptoms     When possible, avoid being in loud places and being exposed to loud  sounds. Wear hearing protection, such as earplugs, when you are exposed to loud noises. Use a white noise machine, a humidifier, or other devices to mask the sound of tinnitus. Practice techniques for reducing stress, such as meditation, yoga, or deep breathing. Work with your health care provider if you need help with managing stress. Sleep with your head slightly raised. This may reduce the impact of tinnitus. General instructions Do not use stimulants, such as nicotine, alcohol, or caffeine. Talk with your health care provider about other stimulants to avoid. Stimulants are substances that can make you feel alert and attentive by increasing certain activities in the body (such as heart rate and blood pressure). These substances may make tinnitus worse. Take over-the-counter and prescription medicines only as told by your health care provider. Try to get plenty of sleep each night. Keep all follow-up visits. This is important. Contact a health care provider if: Your tinnitus continues for 3 weeks or longer without stopping. You develop sudden hearing loss. Your symptoms get worse or do not get better with home care. You feel you are not able to manage the stress of living with tinnitus. Get help right away if: You develop tinnitus after a head injury. You have tinnitus along with any of the following: Dizziness. Nausea and vomiting. Loss of balance. Sudden, severe headache. Vision changes. Facial weakness or weakness of arms or legs. These symptoms may represent a serious problem that is an emergency. Do not wait to see if the symptoms will go away. Get medical help right away. Call your local emergency services (911 in the [place] Do not drive yourself to the hospital. Summary Tinnitus refers to hearing a sound when there is no actual source for that sound. This is often described as ringing in the ears. Symptoms may affect only one ear (unilateral) or both ears (bilateral). Use a white  noise machine, a humidifier, or other devices to mask the sound of tinnitus. Do not use stimulants, such as nicotine, alcohol, or caffeine. These substances may make tinnitus worse. This information is not intended to replace advice given to you by your health care provider. Make sure you discuss any questions you have with your health care provider. Document Revised: 12/28/2019 Document Reviewed: 12/28/2019 Elsevier Patient Education  [place].  Sinus Infection, Adult A sinus infection, also called sinusitis, is inflammation of your sinuses. Sinuses are hollow spaces in the bones around your face. Your sinuses are located: Around your eyes. In the middle of your forehead. Behind your nose. In your cheekbones. Mucus normally drains out of your sinuses. When your nasal tissues become inflamed or swollen, mucus can become trapped or blocked. This allows bacteria, viruses, and fungi to grow, which leads to infection. Most infections of the sinuses are caused by a virus. A sinus infection can develop quickly. It can last for up to 4 weeks (acute) or for more than 12 weeks (chronic). A sinus infection often develops after a cold. What are the causes? This condition is caused by anything that creates swelling in the sinuses or stops mucus from draining. This includes: Allergies. Asthma. Infection from bacteria or viruses. Deformities or blockages in your nose or sinuses. Abnormal growths in the nose (nasal polyps). Pollutants,  such as chemicals or irritants in the air. Infection from fungi. This is rare. What increases the risk? You are more likely to develop this condition if you: Have a weak body defense system (immune system). Do a lot of swimming or diving. Overuse nasal sprays. Smoke. What are the signs or symptoms? The main symptoms of this condition are pain and a feeling of pressure around the affected sinuses. Other symptoms include: Stuffy nose or congestion that makes it  difficult to breathe through your nose. Thick yellow or greenish drainage from your nose. Tenderness, swelling, and warmth over the affected sinuses. A cough that may get worse at night. Decreased sense of smell and taste. Extra mucus that collects in the throat or the back of the nose (postnasal drip) causing a sore throat or bad breath. Tiredness (fatigue). Fever. How is this diagnosed? This condition is diagnosed based on: Your symptoms. Your medical history. A physical exam. Tests to find out if your condition is acute or chronic. This may include: Checking your nose for nasal polyps. Viewing your sinuses using a device that has a light (endoscope). Testing for allergies or bacteria. Imaging tests, such as an MRI or CT scan. In rare cases, a bone biopsy may be done to rule out more serious types of fungal sinus disease. How is this treated? Treatment for a sinus infection depends on the cause and whether your condition is chronic or acute. If caused by a virus, your symptoms should go away on their own within 10 days. You may be given medicines to relieve symptoms. They include: Medicines that shrink swollen nasal passages (decongestants). A spray that eases inflammation of the nostrils (topical intranasal corticosteroids). Rinses that help get rid of thick mucus in your nose (nasal saline washes). Medicines that treat allergies (antihistamines). Over-the-counter pain relievers. If caused by bacteria, your health care provider may recommend waiting to see if your symptoms improve. Most bacterial infections will get better without antibiotic medicine. You may be given antibiotics if you have: A severe infection. A weak immune system. If caused by narrow nasal passages or nasal polyps, surgery may be needed. Follow these instructions at home: Medicines Take, use, or apply over-the-counter and prescription medicines only as told by your health care provider. These may include nasal  sprays. If you were prescribed an antibiotic medicine, take it as told by your health care provider. Do not stop taking the antibiotic even if you start to feel better. Hydrate and humidify  Drink enough fluid to keep your urine pale yellow. Staying hydrated will help to thin your mucus. Use a cool mist humidifier to keep the humidity level in your home above 50%. Inhale steam for 10-15 minutes, 3-4 times a day, or as told by your health care provider. You can do this in the bathroom while a hot shower is running. Limit your exposure to cool or dry air. Rest Rest as much as possible. Sleep with your head raised (elevated). Make sure you get enough sleep each night. General instructions  Apply a warm, moist washcloth to your face 3-4 times a day or as told by your health care provider. This will help with discomfort. Use nasal saline washes as often as told by your health care provider. Wash your hands often with soap and water to reduce your exposure to germs. If soap and water are not available, use hand sanitizer. Do not smoke. Avoid being around people who are smoking (secondhand smoke). Keep all follow-up visits. This is important.  Contact a health care provider if: You have a fever. Your symptoms get worse. Your symptoms do not improve within 10 days. Get help right away if: You have a severe headache. You have persistent vomiting. You have severe pain or swelling around your face or eyes. You have vision problems. You develop confusion. Your neck is stiff. You have trouble breathing. These symptoms may be an emergency. Get help right away. Call 911. Do not wait to see if the symptoms will go away. Do not drive yourself to the hospital. Summary A sinus infection is soreness and inflammation of your sinuses. Sinuses are hollow spaces in the bones around your face. This condition is caused by nasal tissues that become inflamed or swollen. The swelling traps or blocks the flow  of mucus. This allows bacteria, viruses, and fungi to grow, which leads to infection. If you were prescribed an antibiotic medicine, take it as told by your health care provider. Do not stop taking the antibiotic even if you start to feel better. Keep all follow-up visits. This is important. This information is not intended to replace advice given to you by your health care provider. Make sure you discuss any questions you have with your health care provider. Document Revised: 12/27/2020 Document Reviewed: 12/27/2020 Elsevier Patient Education  [place].

## 2021-07-11 NOTE — Progress Notes (Signed)
Pt received 1st dose 04/13/21, has been updated in chart.

## 2021-07-26 ENCOUNTER — Encounter: Payer: Self-pay | Admitting: Gastroenterology

## 2021-08-01 ENCOUNTER — Telehealth: Payer: Self-pay | Admitting: *Deleted

## 2021-08-02 NOTE — Telephone Encounter (Signed)
Called patient. No answer, left a message to let her know colon and PV cancelled and colon not due until 2024 per Dr.Nandigam. encouraged patient to call us back to make office visit with Dr.Nandigam if she is having concerns or symptoms.

## 2021-08-22 ENCOUNTER — Encounter: Payer: Medicare Other | Admitting: Gastroenterology

## 2021-09-05 ENCOUNTER — Other Ambulatory Visit: Payer: Self-pay | Admitting: Internal Medicine

## 2021-09-05 DIAGNOSIS — K649 Unspecified hemorrhoids: Secondary | ICD-10-CM

## 2021-09-05 DIAGNOSIS — L309 Dermatitis, unspecified: Secondary | ICD-10-CM

## 2021-09-14 ENCOUNTER — Ambulatory Visit: Payer: Medicare Other | Admitting: Dermatology

## 2021-11-06 ENCOUNTER — Telehealth: Payer: Self-pay | Admitting: Internal Medicine

## 2021-11-06 NOTE — Telephone Encounter (Signed)
Copied from [place] [id number]. Topic: Medicare AWV >> Nov 06, 2021  1:38 PM Devoria Glassing wrote: Reason for CRM: Left message for patient to schedule Annual Wellness Visit.  Please schedule with Nurse Health Advisor Denisa O'Brien-Blaney, LPN at [facility]. This appt can be telephone or office visit.  Please call [phone] ask for [facility]

## 2021-11-17 ENCOUNTER — Ambulatory Visit: Payer: Medicare Other | Attending: Cardiology | Admitting: Cardiology

## 2021-11-17 ENCOUNTER — Encounter: Payer: Self-pay | Admitting: Cardiology

## 2021-11-17 VITALS — BP 124/70 | HR 81 | Ht 60.0 in | Wt 107.4 lb

## 2021-11-17 DIAGNOSIS — I77810 Thoracic aortic ectasia: Secondary | ICD-10-CM | POA: Insufficient documentation

## 2021-11-17 DIAGNOSIS — I351 Nonrheumatic aortic (valve) insufficiency: Secondary | ICD-10-CM | POA: Insufficient documentation

## 2021-11-17 NOTE — Patient Instructions (Signed)
Medication Instructions:   Your physician recommends that you continue on your current medications as directed. Please refer to the Current Medication list given to you today.   *If you need a refill on your cardiac medications before your next appointment, please call your pharmacy*   Testing/Procedures:  Your physician has requested that you have an echocardiogram in 2 years (11/2023). Echocardiography is a painless test that uses sound waves to create images of your heart. It provides your doctor with information about the size and shape of your heart and how well your heart's chambers and valves are working. This procedure takes approximately one hour. There are no restrictions for this procedure. Please do NOT wear cologne, perfume, aftershave, or lotions (deodorant is allowed). Please arrive 15 minutes prior to your appointment time.    Follow-Up: At [facility], you and your health needs are our priority.  As part of our continuing mission to provide you with exceptional heart care, we have created designated Provider Care Teams.  These Care Teams include your primary Cardiologist (physician) and Advanced Practice Providers (APPs -  Physician Assistants and Nurse Practitioners) who all work together to provide you with the care you need, when you need it.  We recommend signing up for the patient portal called "MyChart".  Sign up information is provided on this After Visit Summary.  MyChart is used to connect with patients for Virtual Visits (Telemedicine).  Patients are able to view lab/test results, encounter notes, upcoming appointments, etc.  Non-urgent messages can be sent to your provider as well.   To learn more about what you can do with MyChart, go to [email or web address].    Your next appointment:   Follow up in 2 years   The format for your next appointment:   In Person  Provider:   Kate Sable, MD    Other Instructions   Important Information  About Sugar

## 2021-11-17 NOTE — Progress Notes (Signed)
Cardiology Office Note:    Date:  11/17/2021   ID:  [name], DOB 10/16/1945, MRN [id number]  PCP:  [name], Pasty Spillers, MD  [facility] Cardiologist:  Debbe Odea, MD  [facility] Electrophysiologist:  None   Referring MD: [name], [name] *   No chief complaint on file.   History of Present Illness:    Beverly Lowe is a 76 y.o. female with a hx of anxiety, mild aortic root dilatation, mild to moderate MR who presents for follow-up.    Previously seen for syncope deemed vasovagal in etiology as she usually has prodromal symptoms of nausea prior to passing out.  Fortunately symptoms alcohol less than once a year..  Echocardiogram was obtained showing mild to moderate MR, mild aortic root dilatation.  Repeat echo 1 year later obtained.  Presents for follow-up.  Prior notes Echo 03/2020 EF 55 to 60%, aortic root dilatation 42 mm. Cardiac monitor 03/2020 occasional paroxysmal SVT, no arrhythmias to suggest etiology for syncope.  Past Medical History:  Diagnosis Date   Anxiety    did not like the way ssri made her feel ? name   Colon polyps    Hemorrhoids    Hemorrhoids    Herpes    oral   Shingles    right back/flank remotely lasting 9 months of pain was on gabapentin   Tinnitus     Past Surgical History:  Procedure Laterality Date   COLONOSCOPY     dental implants      Current Medications: Current Meds  Medication Sig   ALPRAZolam (XANAX) 0.25 MG tablet TAKE 1/2 TABLET(0.125 MG) BY MOUTH DAILY AS NEEDED FOR ANXIETY   Ascorbic Acid (VITAMIN C PO) Take 1 tablet by mouth daily.   CALCIUM PO Take 1 tablet by mouth daily.   hydrocortisone (ANUSOL-HC) 2.5 % rectal cream APPLY RECTALLY TO THE AFFECTED AREA TWICE DAILY AS NEEDED   hydrocortisone 2.5 % cream APPLY TOPICALLY 2 TIMES DAILY. AS NEEDED NECK AND ARM   ipratropium (ATROVENT) 0.03 % nasal spray USE 2 SPRAYS IN EACH NOSTRIL THREE TIMES DAILY AS NEEDED FOR RHINITIS   MAGNESIUM PO  Take 1 tablet by mouth daily.   polyethylene glycol (MIRALAX / GLYCOLAX) 17 g packet Take 17 g by mouth as needed for mild constipation.   valACYclovir (VALTREX) 1000 MG tablet Take 2 tablets (2,000 mg total) by mouth 2 (two) times daily as needed. X 1 day outbreak prn   VITAMIN D PO Take 1 tablet by mouth daily.     Allergies:   Soy allergy and Gluten meal   Social History   Socioeconomic History   Marital status: Married    Spouse name: Not on file   Number of children: 2   Years of education: Not on file   Highest education level: Not on file  Occupational History   Not on file  Tobacco Use   Smoking status: Never   Smokeless tobacco: Never  Vaping Use   Vaping Use: Never used  Substance and Sexual Activity   Alcohol use: Yes    Comment: socially   Drug use: Never   Sexual activity: Yes  Other Topics Concern   Not on file  Social History Narrative   Moved from [place] in 3 or 05/2018 to be near son and grandkids    Never smoker    Exercise yes      DPR Husband Tomiko Schoon [phone]   Social Determinants of Corporate investment banker  Strain: Low Risk  (10/21/2020)   Overall Financial Resource Strain (CARDIA)    Difficulty of Paying Living Expenses: Not hard at all  Food Insecurity: No Food Insecurity (10/21/2020)   Hunger Vital Sign    Worried About Running Out of Food in the Last Year: Never true    Ran Out of Food in the Last Year: Never true  Transportation Needs: No Transportation Needs (10/21/2020)   PRAPARE - Hydrologist (Medical): No    Lack of Transportation (Non-Medical): No  Physical Activity: Sufficiently Active (10/21/2020)   Exercise Vital Sign    Days of Exercise per Week: 5 days    Minutes of Exercise per Session: 30 min  Stress: No Stress Concern Present (10/21/2020)   [place]    Feeling of Stress : Not at all  Social Connections: Unknown  (10/21/2020)   Social Connection and Isolation Panel [NHANES]    Frequency of Communication with Friends and Family: Not on file    Frequency of Social Gatherings with Friends and Family: More than three times a week    Attends Religious Services: Not on Advertising copywriter or Organizations: Not on file    Attends Archivist Meetings: Not on file    Marital Status: Not on file     Family History: The patient's family history includes Atrial fibrillation in her father and nephew; Breast cancer in her maternal aunt; Cancer in her brother and mother; Colon cancer in her brother and father; Diabetes in her father; Heart disease in her father; Osteoporosis in her maternal grandmother and mother.  ROS:   Please see the history of present illness.     All other systems reviewed and are negative.  EKGs/Labs/Other Studies Reviewed:    The following studies were reviewed today:   EKG:  EKG is  ordered today.  The ekg ordered today demonstrates normal sinus rhythm  Recent Labs: 01/17/2021: ALT 11; BUN 11; Creatinine, Ser 0.69; Hemoglobin 13.4; Platelets 199.0; Potassium 3.9; Sodium 141; TSH 1.78  Recent Lipid Panel    Component Value Date/Time   CHOL 207 (H) 01/17/2021 1031   TRIG 108.0 01/17/2021 1031   HDL 74.60 01/17/2021 1031   CHOLHDL 3 01/17/2021 1031   VLDL 21.6 01/17/2021 1031   LDLCALC 111 (H) 01/17/2021 1031     Risk Assessment/Calculations:      Physical Exam:    VS:  BP 124/70   Pulse 81   Ht 5' (1.524 m)   Wt 107 lb 6.4 oz (48.7 kg)   SpO2 93%   BMI 20.98 kg/m     Wt Readings from Last 3 Encounters:  11/17/21 107 lb 6.4 oz (48.7 kg)  07/11/21 107 lb 9.6 oz (48.8 kg)  01/18/21 105 lb (47.6 kg)     GEN:  Well nourished, well developed in no acute distress HEENT: Normal NECK: No JVD; No carotid bruits CARDIAC: RRR, no murmurs, rubs, gallops RESPIRATORY:  Clear to auscultation without rales, wheezing or rhonchi  ABDOMEN: Soft,  non-tender, non-distended MUSCULOSKELETAL:  No edema; No deformity  SKIN: Warm and dry NEUROLOGIC:  Alert and oriented x 3 PSYCHIATRIC:  Normal affect   ASSESSMENT:    1. Aortic valve insufficiency, etiology of cardiac valve disease unspecified   2. Aortic root dilation (HCC)    PLAN:    In order of problems listed above:  Mild to moderate aortic valve regurgitation, repeat  echocardiogram 03/2021 showed no significant change.  Continue to monitor with serial echocardiograms. Mild aortic root dilatation measuring 4.2 cm.  Repeat echo after 1 year shows stability, no significant change.  Follow-up after repeat echo in 2 years.   Medication Adjustments/Labs and Tests Ordered: Current medicines are reviewed at length with the patient today.  Concerns regarding medicines are outlined above.  Orders Placed This Encounter  Procedures   EKG 12-Lead   No orders of the defined types were placed in this encounter.   Patient Instructions  Medication Instructions:   Your physician recommends that you continue on your current medications as directed. Please refer to the Current Medication list given to you today.   *If you need a refill on your cardiac medications before your next appointment, please call your pharmacy*   Testing/Procedures:  Your physician has requested that you have an echocardiogram in 2 years (11/2023). Echocardiography is a painless test that uses sound waves to create images of your heart. It provides your doctor with information about the size and shape of your heart and how well your heart's chambers and valves are working. This procedure takes approximately one hour. There are no restrictions for this procedure. Please do NOT wear cologne, perfume, aftershave, or lotions (deodorant is allowed). Please arrive 15 minutes prior to your appointment time.    Follow-Up: At [facility], you and your health needs are our priority.  As part of our continuing  mission to provide you with exceptional heart care, we have created designated Provider Care Teams.  These Care Teams include your primary Cardiologist (physician) and Advanced Practice Providers (APPs -  Physician Assistants and Nurse Practitioners) who all work together to provide you with the care you need, when you need it.  We recommend signing up for the patient portal called "MyChart".  Sign up information is provided on this After Visit Summary.  MyChart is used to connect with patients for Virtual Visits (Telemedicine).  Patients are able to view lab/test results, encounter notes, upcoming appointments, etc.  Non-urgent messages can be sent to your provider as well.   To learn more about what you can do with MyChart, go to [email or web address].    Your next appointment:   Follow up in 2 years   The format for your next appointment:   In Person  Provider:   Debbe Odea, MD    Other Instructions   Important Information About Sugar         Signed, Debbe Odea, MD  11/17/2021 12:18 PM    [facility]

## 2021-12-05 ENCOUNTER — Telehealth: Payer: Self-pay

## 2021-12-05 NOTE — Telephone Encounter (Signed)
Patient states she needs a refill for her ALPRAZolam (XANAX) 0.25 MG tablet.  Patient states she is out of medication and would like to have it refilled as soon as possible.  *Patient states her preferred pharmacy is [facility] on [address] near [place]

## 2021-12-06 ENCOUNTER — Other Ambulatory Visit: Payer: Self-pay | Admitting: Family

## 2021-12-06 DIAGNOSIS — F419 Anxiety disorder, unspecified: Secondary | ICD-10-CM

## 2021-12-06 MED ORDER — ALPRAZOLAM 0.25 MG PO TABS: ORAL_TABLET | ORAL | 2 refills | Status: DC

## 2021-12-25 ENCOUNTER — Other Ambulatory Visit: Payer: Self-pay

## 2021-12-25 DIAGNOSIS — R0982 Postnasal drip: Secondary | ICD-10-CM

## 2021-12-25 MED ORDER — IPRATROPIUM BROMIDE 0.03 % NA SOLN: NASAL | 5 refills | Status: DC

## 2022-01-03 ENCOUNTER — Other Ambulatory Visit: Payer: Self-pay

## 2022-01-03 ENCOUNTER — Encounter: Payer: Self-pay | Admitting: Family Medicine

## 2022-01-03 DIAGNOSIS — Z1231 Encounter for screening mammogram for malignant neoplasm of breast: Secondary | ICD-10-CM

## 2022-01-08 NOTE — Addendum Note (Signed)
Addended by: Charlyne Mom D on: 01/08/2022 10:19 AM   Modules accepted: Orders

## 2022-02-15 ENCOUNTER — Ambulatory Visit
Admission: RE | Admit: 2022-02-15 | Discharge: 2022-02-15 | Disposition: A | Discharge status: Home / Self Care | Payer: Medicare Other | Source: Ambulatory Visit | Attending: Family | Admitting: Family

## 2022-02-15 DIAGNOSIS — Z1231 Encounter for screening mammogram for malignant neoplasm of breast: Secondary | ICD-10-CM | POA: Diagnosis not present

## 2022-02-16 ENCOUNTER — Other Ambulatory Visit: Payer: Self-pay | Admitting: Family

## 2022-02-16 DIAGNOSIS — R928 Other abnormal and inconclusive findings on diagnostic imaging of breast: Secondary | ICD-10-CM

## 2022-02-17 ENCOUNTER — Encounter: Payer: Self-pay | Admitting: Family Medicine

## 2022-02-22 NOTE — Telephone Encounter (Signed)
Pt has been scheduled for diag mammo and [place] 1/24

## 2022-02-28 ENCOUNTER — Ambulatory Visit
Admission: RE | Admit: 2022-02-28 | Discharge: 2022-02-28 | Disposition: A | Discharge status: Home / Self Care | Payer: Medicare Other | Source: Ambulatory Visit | Attending: Family | Admitting: Family

## 2022-02-28 ENCOUNTER — Ambulatory Visit: Payer: Medicare Other

## 2022-02-28 DIAGNOSIS — R928 Other abnormal and inconclusive findings on diagnostic imaging of breast: Secondary | ICD-10-CM | POA: Insufficient documentation

## 2022-03-27 IMAGING — DX DG CHEST 2V
2 series · 2 of 2 positions shown · non-contrast
Comparison: None.

CLINICAL DATA: Hypotension

EXAM:
CHEST - 2 VIEW

[chest pa]
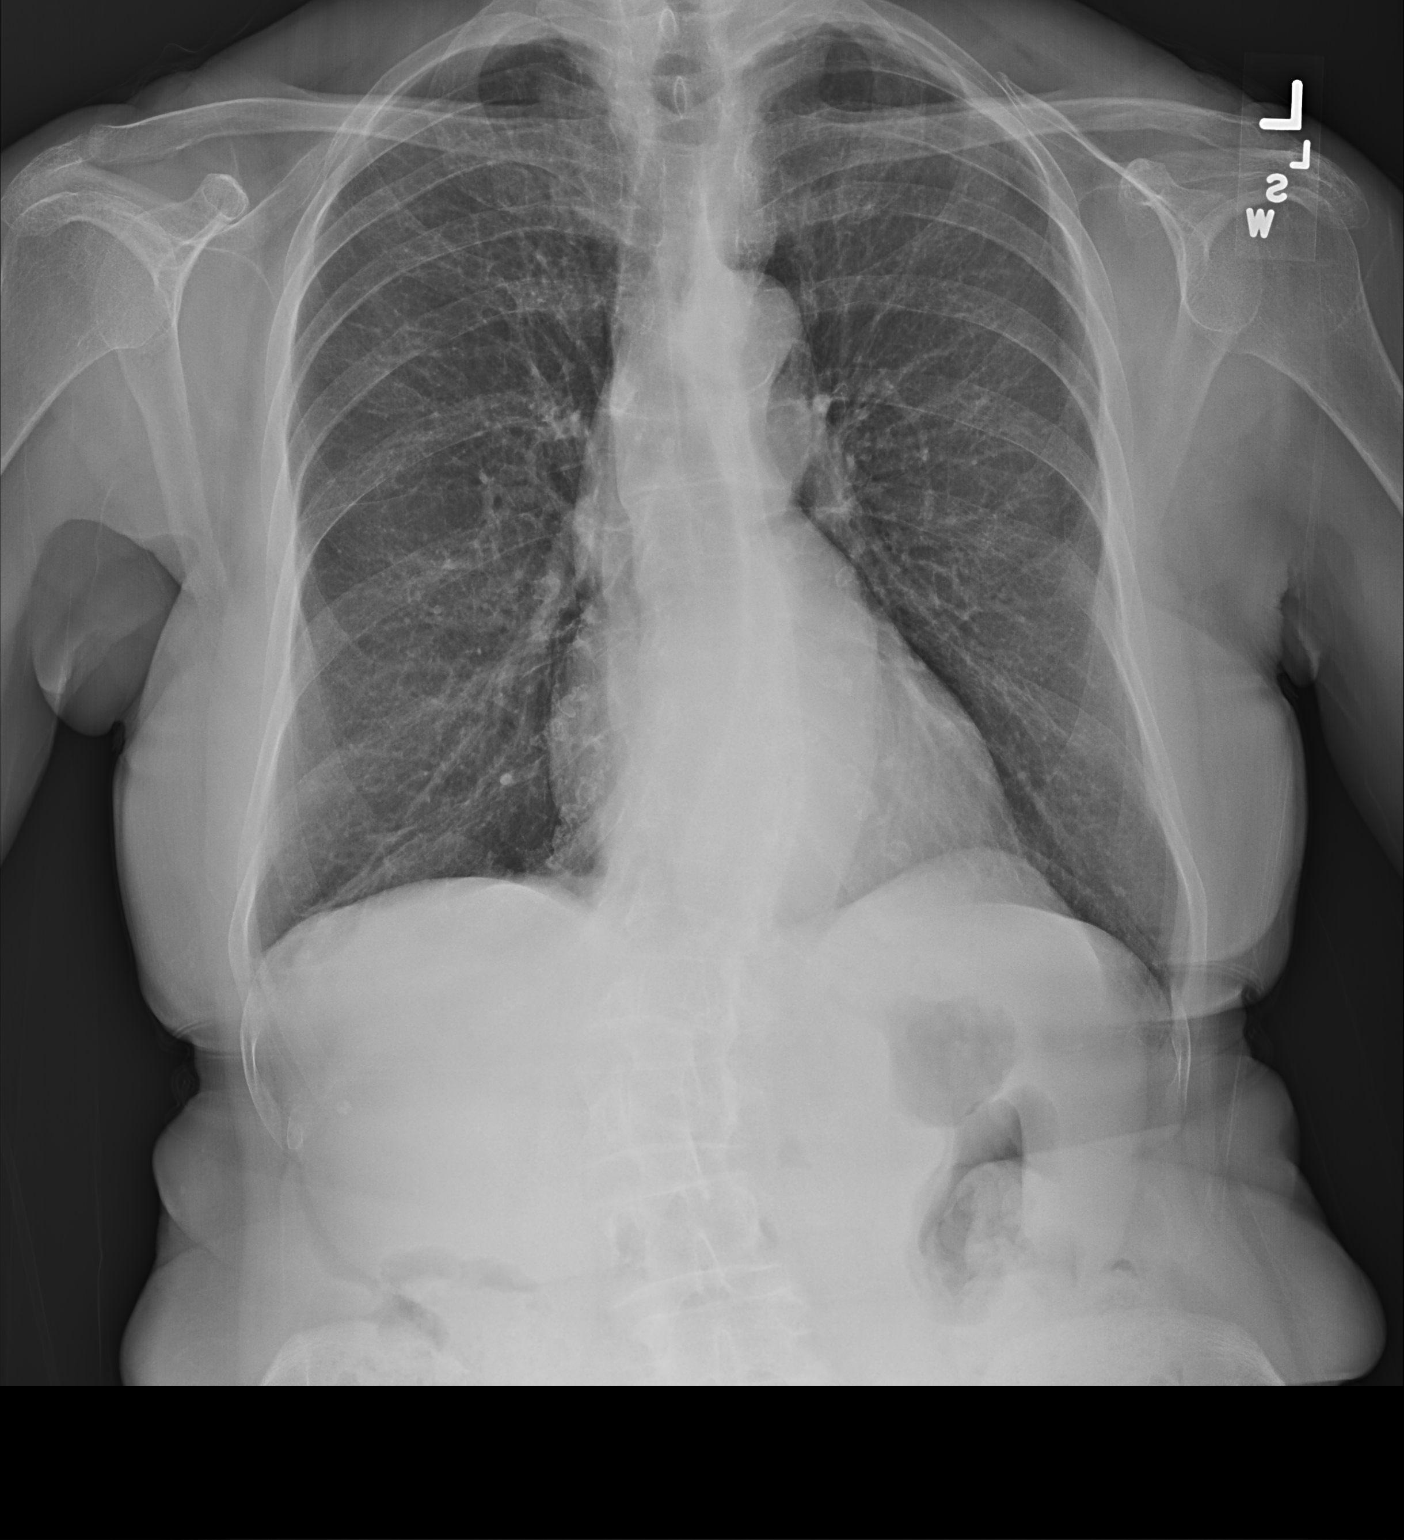

[chest lat]
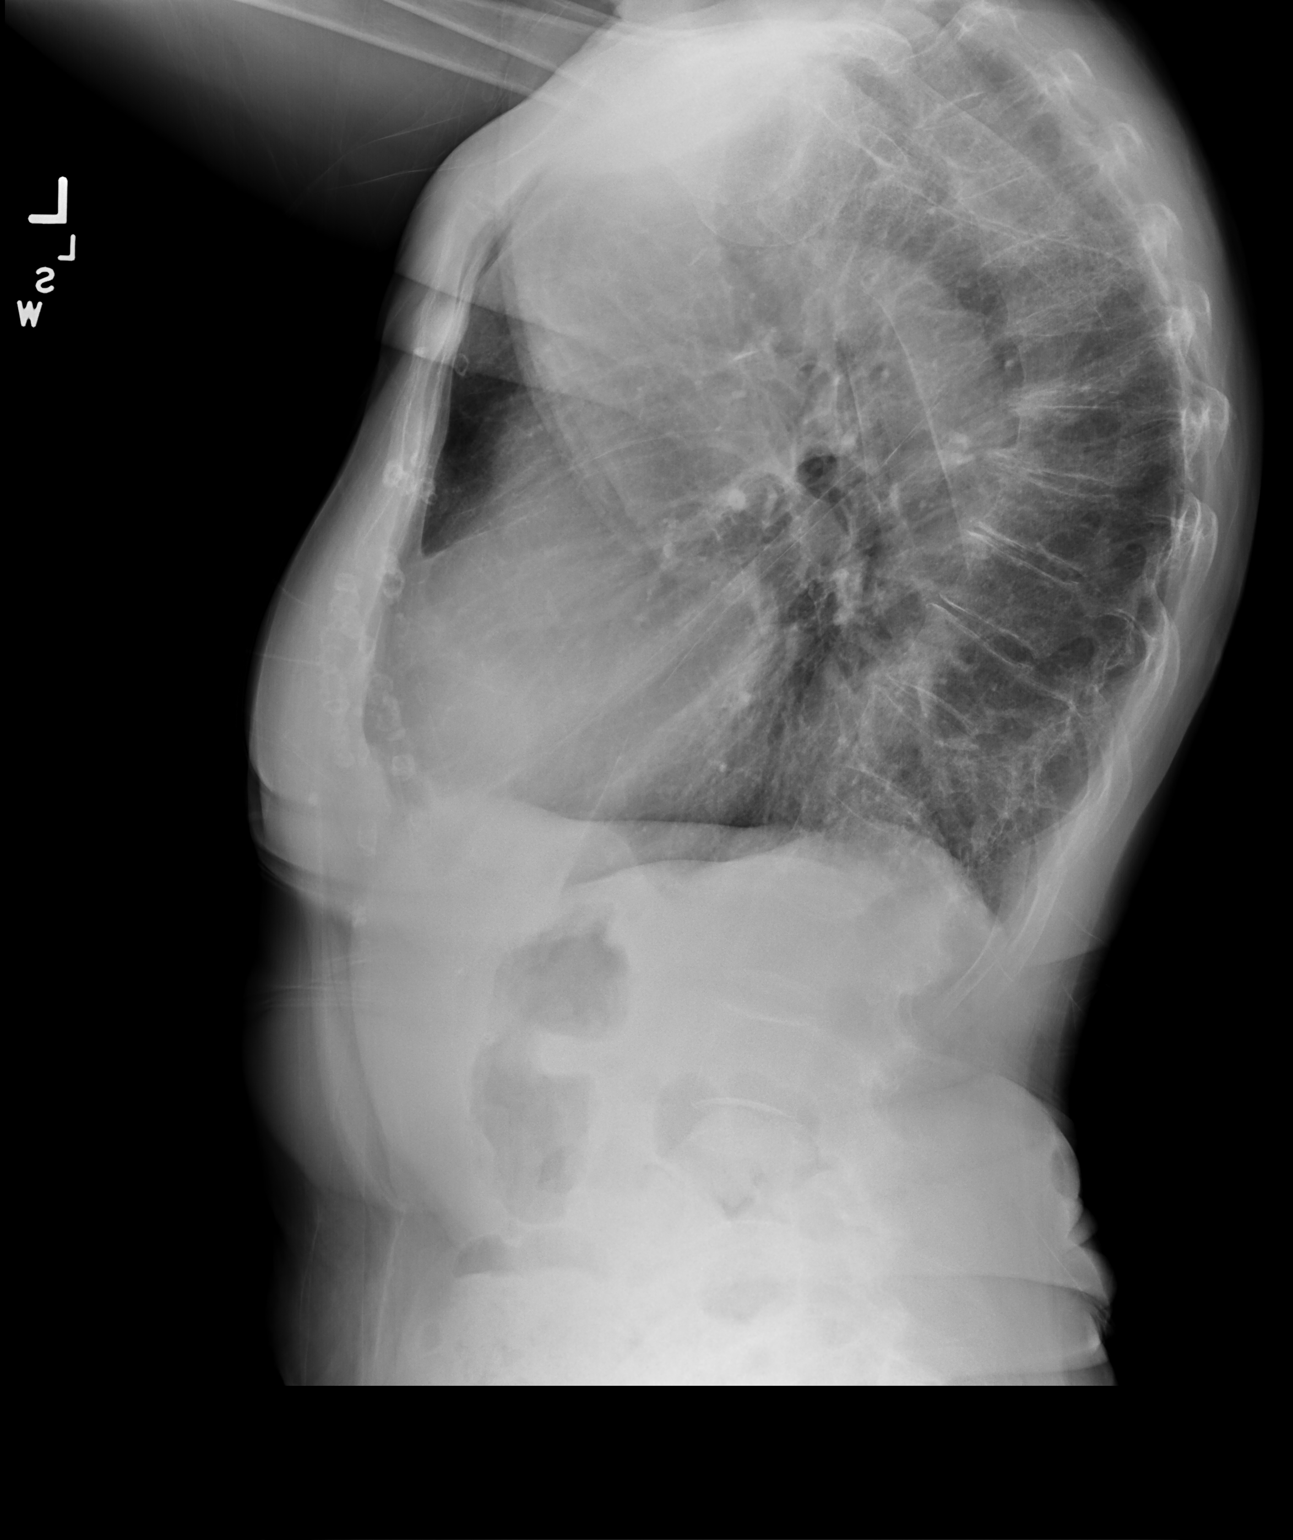

[2 of 2 positions shown; findings below may reference images not displayed]

FINDINGS: The heart size and mediastinal contours are within normal limits.
Atherosclerotic calcification of the aortic knob. Mildly
hyperexpanded lungs. Coarsened interstitial markings bilaterally
without focal airspace consolidation. No pleural effusion or
pneumothorax. Degenerative disc disease within the midthoracic spine
with a mildly exaggerated thoracic kyphosis. S shaped scoliotic
spinal curvature.
IMPRESSION: Chronic bronchitic type lung changes without focal airspace
consolidation.

## 2022-03-28 NOTE — Patient Instructions (Incomplete)
It was a pleasure meeting you today. Thank you for allowing me to take part in your health care.  Our goals for today as we discussed include:  Recommend Tetanus Vaccination.  This is given every 10 years.   Recommend pneumonia 20 vaccine.  One-time vaccination pneumonia.  Recommend Shingles vaccine.  This is a 2 dose series and can be given at your local pharmacy.  Please talk to your pharmacist about this.  Schedule Medicare annual wellness on Tuesday or Thursday afternoon   Follow-up for annual physical exam in June Schedule lab appointment 1 week prior to physical exam.  Please fast for 10 hours prior to lab appointment.   If you have any questions or concerns, please do not hesitate to call the office at [phone].  I look forward to our next visit and until then take care and stay safe.  Regards,   Dana Allan, MD   [facility]

## 2022-03-28 NOTE — Progress Notes (Addendum)
SUBJECTIVE:   Chief Complaint  Patient presents with   Transitions Of Care   HPI Patient presents to clinic to transfer care.  No acute concerns.  Tinnitus Chronic issue.  Has had hearing checked and was recommended to have hearing aids.  Patient not ready to make this decision.  Was prescribed by previous PCP Xanax 0.25 mg half a tablet as needed to help with sleep when tinnitus worsens.  Denies any pulsations, dizziness, headaches or visual changes.  History of fever blisters. Uses Valtrex 2 tablets twice daily as needed.  History of hemorrhoids Controlled with Anusol HC as needed.  Periodically strains with BMs.  MiraLAX as needed but does not use much.  Retin-A cream 5 to by dermatology.  PERTINENT PMH / PSH: Aortic dilation Hemorrhoids Tinnitus left ear secondary to decreased hearing Hyperlipidemia   OBJECTIVE:  BP 110/60   Pulse 66   Temp 97.7 F (36.5 C) (Oral)   Ht 5' (1.524 m)   Wt 110 lb 3.2 oz (50 kg)   SpO2 99%   BMI 21.52 kg/m    Physical Exam Vitals reviewed.  Constitutional:      General: She is not in acute distress.    Appearance: She is normal weight. She is not ill-appearing.  HENT:     Head: Normocephalic.  Eyes:     Conjunctiva/sclera: Conjunctivae normal.  Neck:     Thyroid: No thyromegaly or thyroid tenderness.  Cardiovascular:     Rate and Rhythm: Normal rate and regular rhythm.     Pulses: Normal pulses.  Pulmonary:     Effort: Pulmonary effort is normal.     Breath sounds: Normal breath sounds.  Abdominal:     General: Bowel sounds are normal.  Musculoskeletal:     Right lower leg: No edema.     Left lower leg: No edema.  Neurological:     Mental Status: She is alert. Mental status is at baseline.  Psychiatric:        Mood and Affect: Mood normal.        Behavior: Behavior normal.        Thought Content: Thought content normal.        Judgment: Judgment normal.     ASSESSMENT/PLAN:  Mixed hyperlipidemia Assessment  & Plan: Chronic. Repeat lipids  Orders: -     Lipid panel; Future  Osteoporosis, unspecified osteoporosis type, unspecified pathological fracture presence Assessment & Plan: Chronic. Continue calcium 1200 mg daily Continue vitamin D 800 IU daily Previously declined Prolia and bisphosphonate therapy Continue exercise, to include resistance training  Orders: -     VITAMIN D 25 Hydroxy (Vit-D Deficiency, Fractures); Future  Thyroid disorder screen -     TSH; Future  Tinnitus, unspecified laterality Assessment & Plan: Chronic.  Stable.  Previously worked up and thought to be related to decreasing hearing in the left ear.  Hearing aids were recommended. Was prescribed Xanax 0.125 mg at night to help sleep secondary to tinnitus Discussed weaning Xanax to off, patient prefers to remain on current dose as this is the only thing that has helped her sleep with worsening episodes of tinnitus. Will continue Xanax at current dose.  Encouraged limiting use to only as needed.  Orders: -     Comprehensive metabolic panel; Future -     Vitamin B12; Future  Aortic dilatation (HCC) Assessment & Plan: Chronic.  Stable. Aortic root 42mm stable from last echo.   Follows with cardiology, plan for follow-up and repeat  echo in 2 years.   Hemorrhoids, unspecified hemorrhoid type Assessment & Plan: Chronic.  Stable. Continue Anusol 2.5% twice daily as needed Recommend daily use of MiraLAX Increase soluble fiber in diet Increase water intake Decrease straining with BMs Follow-up as needed.   Chronic constipation Assessment & Plan: Chronic.  Stable. Recommend MiraLAX daily Increase water Increase soluble fiber    Hearing loss of left ear, unspecified hearing loss type Assessment & Plan: Continue to encourage use of hearing aids   HCM Declined Tdap Declined pneumonia vaccine Declined shingles vaccine Declined influenza vaccine Recommend scheduling Medicare annual wellness  visit   PDMP reviewed  Return in about 15 weeks (around 07/12/2022) for annual visit with fasting labs 1 week prior.  Dana Allan, MD

## 2022-03-29 ENCOUNTER — Ambulatory Visit (INDEPENDENT_AMBULATORY_CARE_PROVIDER_SITE_OTHER): Payer: Medicare Other | Admitting: Family Medicine

## 2022-03-29 ENCOUNTER — Encounter: Payer: Self-pay | Admitting: Family Medicine

## 2022-03-29 VITALS — BP 110/60 | HR 66 | Temp 97.7°F | Ht 60.0 in | Wt 110.2 lb

## 2022-03-29 DIAGNOSIS — H9319 Tinnitus, unspecified ear: Secondary | ICD-10-CM

## 2022-03-29 DIAGNOSIS — E782 Mixed hyperlipidemia: Secondary | ICD-10-CM | POA: Diagnosis not present

## 2022-03-29 DIAGNOSIS — I77819 Aortic ectasia, unspecified site: Secondary | ICD-10-CM

## 2022-03-29 DIAGNOSIS — Z1329 Encounter for screening for other suspected endocrine disorder: Secondary | ICD-10-CM

## 2022-03-29 DIAGNOSIS — M810 Age-related osteoporosis without current pathological fracture: Secondary | ICD-10-CM

## 2022-03-29 DIAGNOSIS — K649 Unspecified hemorrhoids: Secondary | ICD-10-CM

## 2022-03-29 DIAGNOSIS — H9192 Unspecified hearing loss, left ear: Secondary | ICD-10-CM

## 2022-03-29 DIAGNOSIS — K5909 Other constipation: Secondary | ICD-10-CM

## 2022-04-01 ENCOUNTER — Encounter: Payer: Self-pay | Admitting: Family Medicine

## 2022-04-01 NOTE — Assessment & Plan Note (Signed)
Chronic.  Stable.  Previously worked up and thought to be related to decreasing hearing in the left ear.  Hearing aids were recommended. Was prescribed Xanax 0.125 mg at night to help sleep secondary to tinnitus Discussed weaning Xanax to off, patient prefers to remain on current dose as this is the only thing that has helped her sleep with worsening episodes of tinnitus. Will continue Xanax at current dose.  Encouraged limiting use to only as needed.

## 2022-04-01 NOTE — Assessment & Plan Note (Signed)
Continue to encourage use of hearing aids

## 2022-04-01 NOTE — Assessment & Plan Note (Signed)
Chronic.  Stable. Recommend MiraLAX daily Increase water Increase soluble fiber

## 2022-04-01 NOTE — Assessment & Plan Note (Signed)
Chronic. Repeat lipids

## 2022-04-01 NOTE — Assessment & Plan Note (Signed)
Chronic. Continue calcium 1200 mg daily Continue vitamin D 800 IU daily Previously declined Prolia and bisphosphonate therapy Continue exercise, to include resistance training

## 2022-04-01 NOTE — Assessment & Plan Note (Addendum)
Chronic.  Stable. Aortic root 42mm stable from last echo.   Follows with cardiology, plan for follow-up and repeat echo in 2 years.

## 2022-04-01 NOTE — Assessment & Plan Note (Signed)
Chronic.  Stable. Continue Anusol 2.5% twice daily as needed Recommend daily use of MiraLAX Increase soluble fiber in diet Increase water intake Decrease straining with BMs Follow-up as needed.

## 2022-04-02 ENCOUNTER — Other Ambulatory Visit (INDEPENDENT_AMBULATORY_CARE_PROVIDER_SITE_OTHER): Payer: Medicare Other

## 2022-04-02 DIAGNOSIS — E782 Mixed hyperlipidemia: Secondary | ICD-10-CM | POA: Diagnosis not present

## 2022-04-02 DIAGNOSIS — M810 Age-related osteoporosis without current pathological fracture: Secondary | ICD-10-CM | POA: Diagnosis not present

## 2022-04-02 DIAGNOSIS — Z1329 Encounter for screening for other suspected endocrine disorder: Secondary | ICD-10-CM | POA: Diagnosis not present

## 2022-04-02 DIAGNOSIS — H9319 Tinnitus, unspecified ear: Secondary | ICD-10-CM

## 2022-04-02 LAB — VITAMIN B12: Vitamin B-12: 332 pg/mL (ref 211–911)

## 2022-04-02 LAB — COMPREHENSIVE METABOLIC PANEL
ALT: 15 U/L (ref 0–35)
AST: 16 U/L (ref 0–37)
Albumin: 4.2 g/dL (ref 3.5–5.2)
Alkaline Phosphatase: 96 U/L (ref 39–117)
BUN: 16 mg/dL (ref 6–23)
CO2: 27 mEq/L (ref 19–32)
Calcium: 9.7 mg/dL (ref 8.4–10.5)
Chloride: 104 mEq/L (ref 96–112)
Creatinine, Ser: 0.67 mg/dL (ref 0.40–1.20)
GFR: 83.96 mL/min (ref >60.00)
Glucose, Bld: 86 mg/dL (ref 70–99)
Potassium: 4.2 mEq/L (ref 3.5–5.1)
Sodium: 141 mEq/L (ref 135–145)
Total Bilirubin: 0.5 mg/dL (ref 0.2–1.2)
Total Protein: 6.7 g/dL (ref 6.0–8.3)

## 2022-04-02 LAB — LIPID PANEL
Cholesterol: 203 mg/dL — ABNORMAL HIGH (ref 0–200)
HDL: 67.2 mg/dL (ref >39.00)
LDL Cholesterol: 115 mg/dL — ABNORMAL HIGH (ref 0–99)
NonHDL: 135.9
Total CHOL/HDL Ratio: 3
Triglycerides: 103 mg/dL (ref 0.0–149.0)
VLDL: 20.6 mg/dL (ref 0.0–40.0)

## 2022-04-02 LAB — VITAMIN D 25 HYDROXY (VIT D DEFICIENCY, FRACTURES): VITD: 37.6 ng/mL (ref 30.00–100.00)

## 2022-04-02 LAB — TSH: TSH: 1.71 u[IU]/mL (ref 0.35–5.50)

## 2022-05-28 ENCOUNTER — Other Ambulatory Visit: Payer: Self-pay | Admitting: Family

## 2022-05-28 DIAGNOSIS — R0982 Postnasal drip: Secondary | ICD-10-CM

## 2022-05-31 ENCOUNTER — Other Ambulatory Visit: Payer: Self-pay | Admitting: Family Medicine

## 2022-05-31 ENCOUNTER — Encounter: Payer: Self-pay | Admitting: Family Medicine

## 2022-05-31 DIAGNOSIS — R0982 Postnasal drip: Secondary | ICD-10-CM

## 2022-05-31 MED ORDER — IPRATROPIUM BROMIDE 0.03 % NA SOLN: NASAL | 5 refills | Status: DC

## 2022-07-12 ENCOUNTER — Encounter: Payer: Self-pay | Admitting: Family Medicine

## 2022-07-12 ENCOUNTER — Ambulatory Visit (INDEPENDENT_AMBULATORY_CARE_PROVIDER_SITE_OTHER): Payer: Medicare Other | Admitting: Family Medicine

## 2022-07-12 VITALS — BP 118/60 | HR 78 | Ht 60.25 in | Wt 107.0 lb

## 2022-07-12 DIAGNOSIS — E559 Vitamin D deficiency, unspecified: Secondary | ICD-10-CM | POA: Diagnosis not present

## 2022-07-12 DIAGNOSIS — Z79899 Other long term (current) drug therapy: Secondary | ICD-10-CM | POA: Diagnosis not present

## 2022-07-12 DIAGNOSIS — H9319 Tinnitus, unspecified ear: Secondary | ICD-10-CM

## 2022-07-12 DIAGNOSIS — Z86010 Personal history of colonic polyps: Secondary | ICD-10-CM | POA: Diagnosis not present

## 2022-07-12 DIAGNOSIS — Z1231 Encounter for screening mammogram for malignant neoplasm of breast: Secondary | ICD-10-CM

## 2022-07-12 DIAGNOSIS — E785 Hyperlipidemia, unspecified: Secondary | ICD-10-CM

## 2022-07-12 NOTE — Patient Instructions (Addendum)
It was a pleasure meeting you today. Thank you for allowing me to take part in your health care.  Our goals for today as we discussed include:  Referral sent to GI for colonoscopy  Recommend Shingles vaccine.  This is a 2 dose series and can be given at your local pharmacy.  Please talk to your pharmacist about this.   Recommend Tetanus Vaccination.  This is given every 10 years.   Recommend Pneumonia 20 Vaccination.    Schedule Medicare Annual Wellness Visit   Recommend follow up with ENT.  Could consider referral to Neurotology.    If you have any questions or concerns, please do not hesitate to call the office at [phone].  I look forward to our next visit and until then take care and stay safe.  Regards,   Dana Allan, MD   [facility]

## 2022-07-12 NOTE — Progress Notes (Signed)
SUBJECTIVE:   Chief Complaint  Patient presents with   Follow-up    15 wks f/u   HPI Patient presents to clinic for follow-up chronic issues.  No acute concerns.  Tinnitus Continues to have issues with ringing in ears bilaterally.  Has had workup completed in past.  Was seen by Dr. Jenne Campus ENT who recommended hearing aids.  She has not been ready to make the decision to wear hearing aids at this time.  She uses Xanax 0.25 mg at night to help with sleep as this is when the tinnitus seems to worsen.  Patient reports family history of colon polyps.  Last colonoscopy 10/2017. Notable for moderate internal hemorrhoids, divert. torturous colon, x 2 polyps.  Dr. Elwyn Lade.  Unable to view colonoscopy records in epic or Care Everywhere.  Is requesting referral for repeat colonoscopy.  Denies any weight loss, diarrhea, bloody stool.   PERTINENT PMH / PSH: Aortic dilation Hemorrhoids Tinnitus left ear secondary to decreased hearing Hyperlipidemia   OBJECTIVE:  BP 118/60 (BP Location: Left Arm, Patient Position: Sitting, Cuff Size: Normal)   Pulse 78   Ht 5' 0.25" (1.53 m)   Wt 107 lb (48.5 kg)   SpO2 96%   BMI 20.72 kg/m    Physical Exam Vitals reviewed.  Constitutional:      General: She is not in acute distress.    Appearance: Normal appearance. She is normal weight. She is not ill-appearing, toxic-appearing or diaphoretic.  HENT:     Right Ear: Tympanic membrane, ear canal and external ear normal. There is no impacted cerumen.     Left Ear: Tympanic membrane, ear canal and external ear normal. There is no impacted cerumen.  Eyes:     General:        Right eye: No discharge.        Left eye: No discharge.     Conjunctiva/sclera: Conjunctivae normal.  Neck:     Thyroid: No thyromegaly or thyroid tenderness.  Cardiovascular:     Rate and Rhythm: Normal rate and regular rhythm.     Heart sounds: Normal heart sounds.  Pulmonary:     Effort: Pulmonary effort is normal.      Breath sounds: Normal breath sounds.  Abdominal:     General: Bowel sounds are normal.  Musculoskeletal:        General: Normal range of motion.  Skin:    General: Skin is warm and dry.  Neurological:     General: No focal deficit present.     Mental Status: She is alert and oriented to person, place, and time. Mental status is at baseline.  Psychiatric:        Mood and Affect: Mood normal.        Behavior: Behavior normal.        Thought Content: Thought content normal.        Judgment: Judgment normal.     ASSESSMENT/PLAN:  Tinnitus, unspecified laterality Assessment & Plan: Chronic.  Stable.  Previously worked up and thought to be related to decreasing hearing in the left ear.  Hearing aids were recommended. Was prescribed Xanax 0.125 mg at night to help sleep secondary to tinnitus Non opioid contract today UDS today PDMP reviewed and appropriate Does not require refill today. Could consider neurotology in future   Chronic use of benzodiazepine for therapeutic purpose -     ToxASSURE Select 13 (MW), Urine  History of colon polyps -     Ambulatory referral to Gastroenterology  Vitamin D deficiency -     VITAMIN D 25 Hydroxy (Vit-D Deficiency, Fractures); Future  Hyperlipidemia, unspecified hyperlipidemia type -     LDL cholesterol, direct; Future  Breast cancer screening by mammogram -     3D Screening Mammogram, Left and Right; Future  HCM Declined Tdap Declined pneumonia vaccine Declined shingles vaccine Declined influenza vaccine Recommend scheduling Medicare annual wellness visit Dexa completed Referral sent for Colonoscopy, history of colon polyps Mammogram up to date.  Due 02/2023.  Referral has been placed  PDMP reviewed  Return for annual visit with fasting labs 1 week prior.  Dana Allan, MD

## 2022-07-22 ENCOUNTER — Encounter: Payer: Self-pay | Admitting: Family Medicine

## 2022-07-22 DIAGNOSIS — Z1231 Encounter for screening mammogram for malignant neoplasm of breast: Secondary | ICD-10-CM | POA: Insufficient documentation

## 2022-07-22 DIAGNOSIS — Z79899 Other long term (current) drug therapy: Secondary | ICD-10-CM | POA: Insufficient documentation

## 2022-07-22 DIAGNOSIS — E559 Vitamin D deficiency, unspecified: Secondary | ICD-10-CM | POA: Insufficient documentation

## 2022-07-22 DIAGNOSIS — Z86010 Personal history of colonic polyps: Secondary | ICD-10-CM | POA: Insufficient documentation

## 2022-07-22 HISTORY — DX: Other long term (current) drug therapy: Z79.899

## 2022-07-22 HISTORY — DX: Vitamin D deficiency, unspecified: E55.9

## 2022-07-22 NOTE — Assessment & Plan Note (Signed)
Chronic.  Stable.  Previously worked up and thought to be related to decreasing hearing in the left ear.  Hearing aids were recommended. Was prescribed Xanax 0.125 mg at night to help sleep secondary to tinnitus Non opioid contract today UDS today PDMP reviewed and appropriate Does not require refill today. Could consider neurotology in future

## 2022-07-23 ENCOUNTER — Encounter: Payer: Self-pay | Admitting: Gastroenterology

## 2022-09-05 ENCOUNTER — Encounter (INDEPENDENT_AMBULATORY_CARE_PROVIDER_SITE_OTHER): Payer: Self-pay

## 2022-09-13 ENCOUNTER — Other Ambulatory Visit (INDEPENDENT_AMBULATORY_CARE_PROVIDER_SITE_OTHER): Payer: Medicare Other

## 2022-09-13 DIAGNOSIS — E559 Vitamin D deficiency, unspecified: Secondary | ICD-10-CM

## 2022-09-13 DIAGNOSIS — E785 Hyperlipidemia, unspecified: Secondary | ICD-10-CM

## 2022-09-13 LAB — LDL CHOLESTEROL, DIRECT: Direct LDL: 131 mg/dL

## 2022-09-13 LAB — VITAMIN D 25 HYDROXY (VIT D DEFICIENCY, FRACTURES): VITD: 61.48 ng/mL (ref 30.00–100.00)

## 2022-09-20 ENCOUNTER — Ambulatory Visit (INDEPENDENT_AMBULATORY_CARE_PROVIDER_SITE_OTHER): Payer: Medicare Other | Admitting: Family Medicine

## 2022-09-20 ENCOUNTER — Encounter: Payer: Self-pay | Admitting: Family Medicine

## 2022-09-20 ENCOUNTER — Encounter (INDEPENDENT_AMBULATORY_CARE_PROVIDER_SITE_OTHER): Payer: Self-pay

## 2022-09-20 VITALS — BP 122/66 | HR 70 | Temp 98.0°F | Resp 16 | Ht 60.0 in | Wt 104.2 lb

## 2022-09-20 DIAGNOSIS — H9319 Tinnitus, unspecified ear: Secondary | ICD-10-CM

## 2022-09-20 DIAGNOSIS — Z79899 Other long term (current) drug therapy: Secondary | ICD-10-CM | POA: Diagnosis not present

## 2022-09-20 DIAGNOSIS — Z0000 Encounter for general adult medical examination without abnormal findings: Secondary | ICD-10-CM

## 2022-09-20 DIAGNOSIS — I351 Nonrheumatic aortic (valve) insufficiency: Secondary | ICD-10-CM

## 2022-09-20 NOTE — Assessment & Plan Note (Signed)
Chronic  Asymptomatic Follow up with Cardiology as scheduled

## 2022-09-20 NOTE — Assessment & Plan Note (Signed)
Chronic.  Stable.   Was prescribed Xanax 0.125 mg at night to help sleep secondary to tinnitus Non opioid contract completed at last visit UDS today PDMP reviewed and appropriate Does not require refill today. Could consider neurotology in future

## 2022-09-20 NOTE — Progress Notes (Signed)
SUBJECTIVE:   Chief Complaint  Patient presents with   Annual Exam   HPI Patient presents for annual physical  No acute concerns  Chronic Tinnitus Xanax 0.125 mg as needed  Denies SI/HI Denies falls  Scheduled for Cardiology f/u for AVR/MVR and history of syncope.  Has not had any recent episodes of syncope.  Review of Systems - Negative except listed in HPI    PERTINENT PMH / PSH: HLD Chronic Tinnitus Aortic Valve Regurgitation  OBJECTIVE:  BP 122/66   Pulse 70   Temp 98 F (36.7 C)   Resp 16   Ht 5' (1.524 m)   Wt 104 lb 4 oz (47.3 kg)   SpO2 97%   BMI 20.36 kg/m    Physical Exam Vitals reviewed.  Constitutional:      General: She is not in acute distress.    Appearance: She is not ill-appearing.  HENT:     Head: Normocephalic.     Right Ear: Tympanic membrane, ear canal and external ear normal.     Left Ear: Tympanic membrane, ear canal and external ear normal.     Nose: Nose normal.     Mouth/Throat:     Mouth: Mucous membranes are moist.  Eyes:     Extraocular Movements: Extraocular movements intact.     Conjunctiva/sclera: Conjunctivae normal.     Pupils: Pupils are equal, round, and reactive to light.  Neck:     Thyroid: No thyromegaly or thyroid tenderness.     Vascular: No carotid bruit.  Cardiovascular:     Rate and Rhythm: Normal rate and regular rhythm.     Pulses: Normal pulses.     Heart sounds: Normal heart sounds.  Pulmonary:     Effort: Pulmonary effort is normal.     Breath sounds: Normal breath sounds.  Abdominal:     General: Bowel sounds are normal. There is no distension.     Palpations: Abdomen is soft.     Tenderness: There is no abdominal tenderness. There is no right CVA tenderness, left CVA tenderness, guarding or rebound.  Musculoskeletal:        General: Normal range of motion.     Cervical back: Normal range of motion.     Right lower leg: No edema.     Left lower leg: No edema.  Lymphadenopathy:     Cervical:  No cervical adenopathy.  Skin:    Capillary Refill: Capillary refill takes less than 2 seconds.  Neurological:     General: No focal deficit present.     Mental Status: She is alert and oriented to person, place, and time. Mental status is at baseline.     Motor: No weakness.  Psychiatric:        Mood and Affect: Mood normal.        Behavior: Behavior normal.        Thought Content: Thought content normal.        Judgment: Judgment normal.        09/20/2022    9:31 AM 03/29/2022    8:51 AM 07/11/2021   11:07 AM 01/18/2021   12:09 PM 10/21/2020    1:25 PM  Depression screen PHQ 2/9  Decreased Interest 0 0 0 0 0  Down, Depressed, Hopeless 0 0 0 0 0  PHQ - 2 Score 0 0 0 0 0  Altered sleeping 0      Tired, decreased energy 0      Change in appetite 0  Feeling bad or failure about yourself  0      Trouble concentrating 0      Moving slowly or fidgety/restless 0      Suicidal thoughts 0      PHQ-9 Score 0      Difficult doing work/chores Not difficult at all          09/20/2022    9:31 AM 05/05/2019    2:49 PM 09/17/2018    2:34 PM  GAD 7 : Generalized Anxiety Score  Nervous, Anxious, on Edge 0 0 1  Control/stop worrying 0 0 1  Worry too much - different things 0 0 2  Trouble relaxing 0 0 1  Restless 0 0 2  Easily annoyed or irritable 0 0 2  Afraid - awful might happen 0 0 1  Total GAD 7 Score 0 0 10  Anxiety Difficulty Not difficult at all Not difficult at all     ASSESSMENT/PLAN:  Annual physical exam Assessment & Plan: HCM Declined Tdap Declined pneumonia vaccine Declined shingles vaccine Declined influenza vaccine Recommend scheduling Medicare annual wellness visit Recommend regular self breast exams  Colonoscopy in Sept for family history of colon cancer Mammogram up to date Dexa completed HTN/Lipid screening completed PHQ9/GAD screening completed    Chronic use of benzodiazepine for therapeutic purpose -     ToxASSURE Select 13 (MW), Urine  Aortic  valve insufficiency, etiology of cardiac valve disease unspecified Assessment & Plan: Chronic. Asymptomatic Follow up with Cardiology as scheduled   Tinnitus, unspecified laterality Assessment & Plan: Chronic.  Stable.   Was prescribed Xanax 0.125 mg at night to help sleep secondary to tinnitus Non opioid contract completed at last visit UDS today PDMP reviewed and appropriate Does not require refill today. Could consider neurotology in future    PDMP reviewed  Return if symptoms worsen or fail to improve.  Dana Allan, MD

## 2022-09-20 NOTE — Assessment & Plan Note (Addendum)
HCM Declined Tdap Declined pneumonia vaccine Declined shingles vaccine Declined influenza vaccine Recommend scheduling Medicare annual wellness visit Recommend regular self breast exams  Colonoscopy in Sept for family history of colon cancer Mammogram up to date Dexa completed HTN/Lipid screening completed PHQ9/GAD screening completed

## 2022-09-20 NOTE — Patient Instructions (Addendum)
It was a pleasure meeting you today. Thank you for allowing me to take part in your health care.  Our goals for today as we discussed include:  Follow up with Cardiology as scheduled  Follow up with GI for Colonoscopy in September as scheduled  Continue regular self breast exams   Schedule Medicare Annual Wellness Visit   This is a list of the screening recommended for you and due dates:  Health Maintenance  Topic Date Due   DTaP/Tdap/Td vaccine (1 - Tdap) Never done   Pneumonia Vaccine (1 of 1 - PCV) Never done   Zoster (Shingles) Vaccine (2 of 2) 06/08/2021   COVID-19 Vaccine (5 - 2023-24 season) 10/06/2021   Medicare Annual Wellness Visit  10/21/2021   Flu Shot  05/06/2023*   DEXA scan (bone density measurement)  Completed   Hepatitis C Screening  Completed   HPV Vaccine  Aged Out   Colon Cancer Screening  Discontinued  *Topic was postponed. The date shown is not the original due date.     Follow up as needed  Annual visit in 1 year   If you have any questions or concerns, please do not hesitate to call the office at [phone].  I look forward to our next visit and until then take care and stay safe.  Regards,   Dana Allan, MD   [facility]

## 2022-09-24 ENCOUNTER — Other Ambulatory Visit: Payer: Self-pay

## 2022-09-24 ENCOUNTER — Encounter: Payer: Self-pay | Admitting: Family Medicine

## 2022-09-24 DIAGNOSIS — K649 Unspecified hemorrhoids: Secondary | ICD-10-CM

## 2022-09-24 MED ORDER — HYDROCORTISONE (PERIANAL) 2.5 % EX CREA
TOPICAL_CREAM | Freq: Two times a day (BID) | CUTANEOUS | 11 refills | Status: DC | PRN
Start: 2022-09-24 — End: 2023-11-12

## 2022-09-26 LAB — TOXASSURE SELECT 13 (MW), URINE

## 2022-10-19 ENCOUNTER — Ambulatory Visit: Payer: Medicare Other | Admitting: *Deleted

## 2022-10-19 VITALS — Ht 60.0 in | Wt 101.0 lb

## 2022-10-19 DIAGNOSIS — Z86010 Personal history of colonic polyps: Secondary | ICD-10-CM

## 2022-10-19 DIAGNOSIS — Z800 Family history of malignant neoplasm of digestive organs: Secondary | ICD-10-CM

## 2022-10-19 MED ORDER — NA SULFATE-K SULFATE-MG SULF 17.5-3.13-1.6 GM/177ML PO SOLN
1.0000 | Freq: Once | ORAL | 0 refills | Status: AC
Start: 2022-10-19 — End: 2022-10-19

## 2022-10-19 NOTE — Progress Notes (Signed)
Pt's name and DOB verified at the beginning of the pre-visit.  Pt denies any difficulty with ambulating,sitting, laying down or rolling side to side Gave both [facility] main # and MD on call # prior to instructions.  No egg or soy allergy known to patient  No issues known to pt with past sedation with any surgeries or procedures Pt denies having issues being intubated Pt has no issues moving head neck or swallowing No FH of Malignant Hyperthermia Pt is not on diet pills Pt is not on home 02  Pt is not on blood thinners  Pt has frequent issues with constipation RN instructed pt to use Miralax per bottles instructions a week before prep days. Pt states they will Pt is not on dialysis Pt denise any abnormal heart rhythms  Pt denies any upcoming cardiac testing Pt encouraged to use to use Singlecare or Goodrx to reduce cost  Patient's chart reviewed by Cathlyn Parsons CNRA prior to pre-visit and patient appropriate for the [facility].  Pre-visit completed and red dot placed by patient's name on their procedure day (on provider's schedule).  . Visit by phone Pt states weight is 103 lb Instructed pt why it is important to and  to call if they have any changes in health or new medications. Directed them to the # given and on instructions.   Pt states they will.  Instructions reviewed with pt and pt states understanding. Instructed to review again prior to procedure. Pt states they will.  Instructions sent by mail with coupon and by my chart

## 2022-10-23 ENCOUNTER — Encounter: Payer: Self-pay | Admitting: Gastroenterology

## 2022-10-23 ENCOUNTER — Encounter: Payer: Medicare Other | Admitting: *Deleted

## 2022-10-23 ENCOUNTER — Ambulatory Visit (INDEPENDENT_AMBULATORY_CARE_PROVIDER_SITE_OTHER): Payer: Medicare Other | Admitting: *Deleted

## 2022-10-23 VITALS — Ht 60.0 in | Wt 102.4 lb

## 2022-10-23 DIAGNOSIS — Z0000 Encounter for general adult medical examination without abnormal findings: Secondary | ICD-10-CM | POA: Diagnosis not present

## 2022-10-23 NOTE — Progress Notes (Signed)
Subjective:   Beverly Lowe is a 77 y.o. female who presents for Medicare Annual (Subsequent) preventive examination.  Visit Complete: Virtual  I connected with  [name] on 10/23/22 by a audio enabled telemedicine application and verified that I am speaking with the correct person using two identifiers.  Patient Location: Home  Provider Location: Home Office  I discussed the limitations of evaluation and management by telemedicine. The patient expressed understanding and agreed to proceed.  Patient Medicare AWV questionnaire was completed by the patient on 10/19/22; I have confirmed that all information answered by patient is correct and no changes since this date.  Vital Signs: Unable to obtain new vitals due to this being a telehealth visit.   Cardiac Risk Factors include: advanced age (>83men, >5 women);dyslipidemia;Other (see comment), Risk factor comments: Mitral valve insufficency     Objective:    Today's Vitals   10/23/22 1331  Weight: 102 lb 6 oz (46.4 kg)  Height: 5' (1.524 m)   Body mass index is 19.99 kg/m.     10/23/2022    1:44 PM 10/21/2020    1:18 PM 10/21/2019    1:49 PM  Advanced Directives  Does Patient Have a Medical Advance Directive? No No No  Does patient want to make changes to medical advance directive?  No - Patient declined   Would patient like information on creating a medical advance directive? No - Patient declined  No - Patient declined    Current Medications (verified) Outpatient Encounter Medications as of 10/23/2022  Medication Sig   ALPRAZolam (XANAX) 0.25 MG tablet TAKE 1/2 TABLET(0.125 MG) BY MOUTH DAILY AS NEEDED FOR ANXIETY   Ascorbic Acid (VITAMIN C PO) Take 1 tablet by mouth daily.   CALCIUM PO Take 1 tablet by mouth daily.   Cholecalciferol (VITAMIN D) 125 MCG (5000 UT) CAPS Take by mouth daily.   Dapsone 7.5 % GEL Apply topically daily.   hydrocortisone (ANUSOL-HC) 2.5 % rectal cream Place rectally 2 (two) times  daily as needed for hemorrhoids or anal itching.   ipratropium (ATROVENT) 0.03 % nasal spray USE 2 SPRAYS IN EACH NOSTRIL THREE TIMES DAILY AS NEEDED FOR RHINITIS   MAGNESIUM PO Take 1 tablet by mouth daily.   polyethylene glycol (MIRALAX / GLYCOLAX) 17 g packet Take 17 g by mouth as needed for mild constipation.   tretinoin (RETIN-A) 0.05 % cream Apply 1 Application topically at bedtime.   valACYclovir (VALTREX) 1000 MG tablet Take 2 tablets (2,000 mg total) by mouth 2 (two) times daily as needed. X 1 day outbreak prn   VITAMIN D PO Take 1 tablet by mouth daily.   Vitamin D-Vitamin K (VITAMIN K2-VITAMIN D3 PO) Take by mouth daily.   zinc gluconate 50 MG tablet Take 50 mg by mouth daily.   No facility-administered encounter medications on file as of 10/23/2022.    Allergies (verified) Lactose intolerance (gi), Soy allergy, and Gluten meal   History: Past Medical History:  Diagnosis Date   Anemia    Anxiety    did not like the way ssri made her feel ? name   Bowel habit changes 07/11/2021   Cataract    Colon polyps    COVID-19 01/18/2021   Hemorrhoids    Hemorrhoids    Herpes    oral   Insomnia 09/17/2018   Osteoporosis    Shingles    right back/flank remotely lasting 9 months of pain was on gabapentin   Tinnitus    Past Surgical History:  Procedure Laterality Date   COLONOSCOPY     dental implants     NASAL SEPTUM SURGERY     15 years ago   Family History  Problem Relation Age of Onset   Cancer Mother        breast later in 26s and uterine   Osteoporosis Mother    Colon cancer Father        Dx between age 75-60   Diabetes Father    Heart disease Father    Atrial fibrillation Father    Cancer Brother        colon dx in 36s   Colon cancer Brother        Dx in his 44-50   Breast cancer Maternal Aunt    Osteoporosis Maternal Grandmother    Atrial fibrillation Nephew    Colon polyps Neg Hx    Esophageal cancer Neg Hx    Stomach cancer Neg Hx    Rectal cancer  Neg Hx    Social History   Socioeconomic History   Marital status: Married    Spouse name: Not on file   Number of children: 2   Years of education: Not on file   Highest education level: Bachelor's degree (e.g., BA, AB, BS)  Occupational History   Not on file  Tobacco Use   Smoking status: Never   Smokeless tobacco: Never  Vaping Use   Vaping status: Never Used  Substance and Sexual Activity   Alcohol use: Yes    Comment: socially   Drug use: Never   Sexual activity: Yes  Other Topics Concern   Not on file  Social History Narrative   Moved from [place] in 3 or 05/2018 to be near son and grandkids    Never smoker    Exercise yes      DPR Husband Lyndsea Hinebaugh [phone]   Social Determinants of Health   Financial Resource Strain: Low Risk  (10/19/2022)   Overall Financial Resource Strain (CARDIA)    Difficulty of Paying Living Expenses: Not hard at all  Food Insecurity: No Food Insecurity (10/19/2022)   Hunger Vital Sign    Worried About Running Out of Food in the Last Year: Never true    Ran Out of Food in the Last Year: Never true  Transportation Needs: No Transportation Needs (10/19/2022)   PRAPARE - Administrator, Civil Service (Medical): No    Lack of Transportation (Non-Medical): No  Physical Activity: Sufficiently Active (10/19/2022)   Exercise Vital Sign    Days of Exercise per Week: 7 days    Minutes of Exercise per Session: 90 min  Stress: No Stress Concern Present (10/19/2022)   [employer] of Occupational Health - Occupational Stress Questionnaire    Feeling of Stress : Not at all  Social Connections: Socially Integrated (10/19/2022)   Social Connection and Isolation Panel [NHANES]    Frequency of Communication with Friends and Family: Three times a week    Frequency of Social Gatherings with Friends and Family: More than three times a week    Attends Religious Services: More than 4 times per year    Active Member of [employer] or  Organizations: Yes    Attends Engineer, structural: More than 4 times per year    Marital Status: Married    Tobacco Counseling Counseling given: Not Answered   Clinical Intake:  Pre-visit preparation completed: Yes  Pain : No/denies pain     BMI - recorded:  19.99 Nutritional Status: BMI of 19-24  Normal Nutritional Risks: None Diabetes: No  How often do you need to have someone help you when you read instructions, pamphlets, or other written materials from your doctor or pharmacy?: 1 - Never  Interpreter Needed?: No  Information entered by :: R. Loxley Schmale LPN   Activities of Daily Living    10/19/2022    8:58 AM  In your present state of health, do you have any difficulty performing the following activities:  Hearing? 0  Vision? 0  Difficulty concentrating or making decisions? 0  Walking or climbing stairs? 0  Dressing or bathing? 0  Doing errands, shopping? 0  Preparing Food and eating ? N  Using the Toilet? N  In the past six months, have you accidently leaked urine? N  Do you have problems with loss of bowel control? N  Managing your Medications? N  Managing your Finances? N  Housekeeping or managing your Housekeeping? N    Patient Care Team: Dana Allan, MD as PCP - General (Family Medicine) Debbe Odea, MD as PCP - Cardiology (Cardiology) Napoleon Form, MD as Consulting Physician (Gastroenterology) Debbe Odea, MD as Consulting Physician (Cardiology)  Indicate any recent Medical Services you may have received from other than [facility] providers in the past year (date may be approximate).     Assessment:   This is a routine wellness examination for [facility].  Hearing/Vision screen Hearing Screening - Comments:: Hears okay Vision Screening - Comments:: No glasses   Goals Addressed             This Visit's Progress    Patient Stated       Stay healthy and continue a healthy lifestyle       Depression Screen     10/23/2022    1:40 PM 09/20/2022    9:31 AM 03/29/2022    8:51 AM 07/11/2021   11:07 AM 01/18/2021   12:09 PM 10/21/2020    1:25 PM 06/06/2020    4:19 PM  PHQ 2/9 Scores  PHQ - 2 Score 0 0 0 0 0 0 0  PHQ- 9 Score 0 0         Fall Risk    10/19/2022    8:58 AM 09/20/2022    9:31 AM 03/29/2022    8:51 AM 07/11/2021   11:07 AM 01/18/2021   12:09 PM  Fall Risk   Falls in the past year? 0 0 0 0 0  Number falls in past yr: 0 0 0 0 0  Injury with Fall? 0 0 0 0 0  Risk for fall due to : No Fall Risks No Fall Risks No Fall Risks No Fall Risks No Fall Risks  Follow up Falls prevention discussed;Falls evaluation completed Falls evaluation completed Falls evaluation completed Falls evaluation completed Falls evaluation completed    MEDICARE RISK AT HOME: Medicare Risk at Home Any stairs in or around the home?: Yes If so, are there any without handrails?: No Home free of loose throw rugs in walkways, pet beds, electrical cords, etc?: No Adequate lighting in your home to reduce risk of falls?: Yes Life alert?: No Use of a cane, walker or w/c?: No Grab bars in the bathroom?: No Shower chair or bench in shower?: No Elevated toilet seat or a handicapped toilet?: No    Cognitive Function:        10/23/2022    1:45 PM  6CIT Screen  What Year? 0 points  What month? 0 points  What time? 0 points  Count back from 20 0 points  Months in reverse 0 points  Repeat phrase 2 points  Total Score 2 points    Immunizations Immunization History  Administered Date(s) Administered   Fluad Quad(high Dose 65+) 10/03/2018, 11/05/2019, 01/03/2021   PFIZER(Purple Top)SARS-COV-2 Vaccination 02/12/2019, 03/05/2019, 10/05/2019, 10/04/2020   Zoster Recombinant(Shingrix) 04/13/2021    TDAP status: Due, Education has been provided regarding the importance of this vaccine. Advised may receive this vaccine at local pharmacy or Health Dept. Aware to provide a copy of the vaccination record if obtained from local  pharmacy or Health Dept. Verbalized acceptance and understanding.  Flu Vaccine status: Due, Education has been provided regarding the importance of this vaccine. Advised may receive this vaccine at local pharmacy or Health Dept. Aware to provide a copy of the vaccination record if obtained from local pharmacy or Health Dept. Verbalized acceptance and understanding.  Pneumococcal vaccine status: Due, Education has been provided regarding the importance of this vaccine. Advised may receive this vaccine at local pharmacy or Health Dept. Aware to provide a copy of the vaccination record if obtained from local pharmacy or Health Dept. Verbalized acceptance and understanding.  Covid-19 vaccine status: Information provided on how to obtain vaccines.   Qualifies for Shingles Vaccine? Yes   Zostavax completed No   Shingrix Completed?: No.    Education has been provided regarding the importance of this vaccine. Patient has been advised to call insurance company to determine out of pocket expense if they have not yet received this vaccine. Advised may also receive vaccine at local pharmacy or Health Dept. Verbalized acceptance and understanding.  Screening Tests Health Maintenance  Topic Date Due   DTaP/Tdap/Td (1 - Tdap) Never done   COVID-19 Vaccine (5 - 2023-24 season) 10/07/2022   Colonoscopy  10/26/2022   Zoster Vaccines- Shingrix (2 of 2) 12/21/2022 (Originally 06/08/2021)   INFLUENZA VACCINE  05/06/2023 (Originally 09/06/2022)   Pneumonia Vaccine 56+ Years old (1 of 1 - PCV) 09/20/2023 (Originally 03/29/2009)   Medicare Annual Wellness (AWV)  10/23/2023   DEXA SCAN  Completed   Hepatitis C Screening  Completed   HPV VACCINES  Aged Out    Health Maintenance  Health Maintenance Due  Topic Date Due   DTaP/Tdap/Td (1 - Tdap) Never done   COVID-19 Vaccine (5 - 2023-24 season) 10/07/2022   Colonoscopy  10/26/2022    Colorectal cancer screening: Type of screening: Colonoscopy. Completed 9/19.  Repeat every 5 years Scheduled 11/04/22  Mammogram status: Completed 9/24. Repeat every year  Bone Density status: Completed 10/20. Results reflect: Bone density results: OSTEOPOROSIS. Repeat every 2 years. Will discuss with PCP at next visit  Lung Cancer Screening: (Low Dose CT Chest recommended if Age 58-80 years, 20 pack-year currently smoking OR have quit w/in 15years.) does not qualify.     Additional Screening:  Hepatitis C Screening: does qualify; Completed 8/20  Vision Screening: Recommended annual ophthalmology exams for early detection of glaucoma and other disorders of the eye. Is the patient up to date with their annual eye exam?  Yes  Who is the provider or what is the name of the office in which the patient attends annual eye exams? [facility] If pt is not established with a provider, would they like to be referred to a provider to establish care? No .   Dental Screening: Recommended annual dental exams for proper oral hygiene   Community Resource Referral / Chronic Care Management: CRR required this visit?  No   CCM required this visit?  No     Plan:     I have personally reviewed and noted the following in the patient's chart:   Medical and social history Use of alcohol, tobacco or illicit drugs  Current medications and supplements including opioid prescriptions. Patient is not currently taking opioid prescriptions. Functional ability and status Nutritional status Physical activity Advanced directives List of other physicians Hospitalizations, surgeries, and ER visits in previous 12 months Vitals Screenings to include cognitive, depression, and falls Referrals and appointments  In addition, I have reviewed and discussed with patient certain preventive protocols, quality metrics, and best practice recommendations. A written personalized care plan for preventive services as well as general preventive health recommendations were provided to patient.      Sydell Axon, LPN   07/07/930   After Visit Summary: (MyChart) Due to this being a telephonic visit, the after visit summary with patients personalized plan was offered to patient via MyChart   Nurse Notes: None

## 2022-10-23 NOTE — Patient Instructions (Signed)
Beverly Lowe , Thank you for taking time to come for your Medicare Wellness Visit. I appreciate your ongoing commitment to your health goals. Please review the following plan we discussed and let me know if I can assist you in the future.   Referrals/Orders/Follow-Ups/Clinician Recommendations: None  This is a list of the screening recommended for you and due dates:  Health Maintenance  Topic Date Due   DTaP/Tdap/Td vaccine (1 - Tdap) Never done   COVID-19 Vaccine (5 - 2023-24 season) 10/07/2022   Colon Cancer Screening  10/26/2022   Zoster (Shingles) Vaccine (2 of 2) 12/21/2022*   Flu Shot  05/06/2023*   Pneumonia Vaccine (1 of 1 - PCV) 09/20/2023*   Medicare Annual Wellness Visit  10/23/2023   DEXA scan (bone density measurement)  Completed   Hepatitis C Screening  Completed   HPV Vaccine  Aged Out  *Topic was postponed. The date shown is not the original due date.    Advanced directives: (Declined) Advance directive discussed with you today. Even though you declined this today, please call our office should you change your mind, and we can give you the proper paperwork for you to fill out. Will pick up at next office visit.  Next Medicare Annual Wellness Visit scheduled for next year: Yes 10/28/23 @ 1:30

## 2022-10-28 ENCOUNTER — Encounter: Payer: Self-pay | Admitting: Certified Registered Nurse Anesthetist

## 2022-11-02 ENCOUNTER — Ambulatory Visit (AMBULATORY_SURGERY_CENTER): Payer: Medicare Other | Admitting: Gastroenterology

## 2022-11-02 ENCOUNTER — Encounter: Payer: Self-pay | Admitting: Gastroenterology

## 2022-11-02 VITALS — BP 106/58 | HR 64 | Temp 98.6°F | Resp 10 | Ht 60.0 in | Wt 101.0 lb

## 2022-11-02 DIAGNOSIS — Z86010 Personal history of colonic polyps: Secondary | ICD-10-CM

## 2022-11-02 DIAGNOSIS — Z09 Encounter for follow-up examination after completed treatment for conditions other than malignant neoplasm: Secondary | ICD-10-CM | POA: Diagnosis not present

## 2022-11-02 DIAGNOSIS — Z800 Family history of malignant neoplasm of digestive organs: Secondary | ICD-10-CM

## 2022-11-02 MED ORDER — SODIUM CHLORIDE 0.9 % IV SOLN: 500.0000 mL | Freq: Once | INTRAVENOUS | Status: DC

## 2022-11-02 NOTE — Progress Notes (Signed)
Report given to PACU, vss 

## 2022-11-02 NOTE — Patient Instructions (Addendum)
-   Resume previous diet. - Continue present medications. - No repeat colonoscopy due to age. - Dr. Lavon Paganini office nurse to schedule Hemorrhoid band appointment  YOU HAD AN ENDOSCOPIC PROCEDURE TODAY AT THE [facility]:   Refer to the procedure report that was given to you for any specific questions about what was found during the examination.  If the procedure report does not answer your questions, please call your gastroenterologist to clarify.  If you requested that your care partner not be given the details of your procedure findings, then the procedure report has been included in a sealed envelope for you to review at your convenience later.   YOU SHOULD EXPECT: Some feelings of bloating in the abdomen. Passage of more gas than usual.  Walking can help get rid of the air that was put into your GI tract during the procedure and reduce the bloating. If you had a lower endoscopy (such as a colonoscopy or flexible sigmoidoscopy) you may notice spotting of blood in your stool or on the toilet paper. If you underwent a bowel prep for your procedure, you may not have a normal bowel movement for a few days.  Please Note:  You might notice some irritation and congestion in your nose or some drainage.  This is from the oxygen used during your procedure.  There is no need for concern and it should clear up in a day or so.  SYMPTOMS TO REPORT IMMEDIATELY:  Following lower endoscopy (colonoscopy or flexible sigmoidoscopy):  Excessive amounts of blood in the stool  Significant tenderness or worsening of abdominal pains  Swelling of the abdomen that is new, acute  Fever of 100F or higher  For urgent or emergent issues, a gastroenterologist can be reached at any hour by calling [phone]. Do not use MyChart messaging for urgent concerns.    DIET:  We do recommend a small meal at first, but then you may proceed to your regular diet.  Drink plenty of fluids but you should avoid alcoholic  beverages for 24 hours.  ACTIVITY:  You should plan to take it easy for the rest of today and you should NOT DRIVE or use heavy machinery until tomorrow (because of the sedation medicines used during the test).    FOLLOW UP: Our staff will call the number listed on your records the next business day following your procedure.  We will call around 7:15- 8:00 am to check on you and address any questions or concerns that you may have regarding the information given to you following your procedure. If we do not reach you, we will leave a message.     If any biopsies were taken you will be contacted by phone or by letter within the next 1-3 weeks.  Please call us at [phone] if you have not heard about the biopsies in 3 weeks.    SIGNATURES/CONFIDENTIALITY: You and/or your care partner have signed paperwork which will be entered into your electronic medical record.  These signatures attest to the fact that that the information above on your After Visit Summary has been reviewed and is understood.  Full responsibility of the confidentiality of this discharge information lies with you and/or your care-partner.

## 2022-11-02 NOTE — Op Note (Signed)
[facility] Patient Name: Beverly Lowe Procedure Date: 11/02/2022 10:50 AM MRN: [id number] Endoscopist: Napoleon Form , MD, [id number] Age: 77 Referring MD:  Date of Birth: 30-Oct-1945 Gender: Female Account #: [id number] Procedure:                Colonoscopy Indications:              Screening in patient at increased risk: Family                            history of 1st-degree relative with colorectal                            cancer, High risk colon cancer surveillance:                            Personal history of colonic polyps Medicines:                Monitored Anesthesia Care Procedure:                Pre-Anesthesia Assessment:                           - Prior to the procedure, a History and Physical                            was performed, and patient medications and                            allergies were reviewed. The patient's tolerance of                            previous anesthesia was also reviewed. The risks                            and benefits of the procedure and the sedation                            options and risks were discussed with the patient.                            All questions were answered, and informed consent                            was obtained. Prior Anticoagulants: The patient has                            taken no anticoagulant or antiplatelet agents. ASA                            Grade Assessment: II - A patient with mild systemic                            disease. After reviewing the risks and benefits,  the patient was deemed in satisfactory condition to                            undergo the procedure.                           After obtaining informed consent, the colonoscope                            was passed under direct vision. Throughout the                            procedure, the patient's blood pressure, pulse, and                            oxygen saturations were  monitored continuously. The                            Olympus PCF-H190DL (#[id number]) Colonoscope was                            introduced through the anus and advanced to the the                            cecum, identified by appendiceal orifice and                            ileocecal valve. The colonoscopy was performed                            without difficulty. The patient tolerated the                            procedure well. The quality of the bowel                            preparation was good. The ileocecal valve,                            appendiceal orifice, and rectum were photographed. Scope In: 11:03:29 AM Scope Out: 11:15:46 AM Scope Withdrawal Time: 0 hours 8 minutes 0 seconds  Total Procedure Duration: 0 hours 12 minutes 17 seconds  Findings:                 The perianal and digital rectal examinations were                            normal.                           Scattered small-mouthed diverticula were found in                            the sigmoid colon, descending colon, transverse  colon and ascending colon.                           Non-bleeding external and internal hemorrhoids were                            found during retroflexion. The hemorrhoids were                            medium-sized. Complications:            No immediate complications. Estimated Blood Loss:     Estimated blood loss: none. Impression:               - Diverticulosis in the sigmoid colon, in the                            descending colon, in the transverse colon and in                            the ascending colon.                           - Non-bleeding external and internal hemorrhoids.                           - No specimens collected. Recommendation:           - Patient has a contact number available for                            emergencies. The signs and symptoms of potential                            delayed complications were  discussed with the                            patient. Return to normal activities tomorrow.                            Written discharge instructions were provided to the                            patient.                           - Resume previous diet.                           - Continue present medications.                           - No repeat colonoscopy due to age. Napoleon Form, MD 11/02/2022 11:20:49 AM This report has been signed electronically.

## 2022-11-02 NOTE — Progress Notes (Signed)
[facility] History and Physical   Primary Care Physician:  Dana Allan, MD   Reason for Procedure:  History of adenomatous colon polyps, family h/o colon cancer  Plan:    Surveillance colonoscopy with possible interventions as needed     HPI: Beverly Lowe is a very pleasant 77 y.o. female here for surveillance colonoscopy. Denies any nausea, vomiting, abdominal pain, melena or bright red blood per rectum  The risks and benefits as well as alternatives of endoscopic procedure(s) have been discussed and reviewed. All questions answered. The patient agrees to proceed.    Past Medical History:  Diagnosis Date   Anemia    Anxiety    did not like the way ssri made her feel ? name   Bowel habit changes 07/11/2021   Cataract    Colon polyps    COVID-19 01/18/2021   Hemorrhoids    Hemorrhoids    Herpes    oral   Insomnia 09/17/2018   Osteoporosis    Shingles    right back/flank remotely lasting 9 months of pain was on gabapentin   Tinnitus     Past Surgical History:  Procedure Laterality Date   COLONOSCOPY     dental implants     NASAL SEPTUM SURGERY     15 years ago    Prior to Admission medications   Medication Sig Start Date End Date Taking? Authorizing Provider  Ascorbic Acid (VITAMIN C PO) Take 1 tablet by mouth daily.   Yes [provider]  CALCIUM PO Take 1 tablet by mouth daily.   Yes [provider]  Cholecalciferol (VITAMIN D) 125 MCG (5000 UT) CAPS Take by mouth daily.   Yes [provider]  MAGNESIUM PO Take 1 tablet by mouth daily.   Yes [provider]  polyethylene glycol (MIRALAX / GLYCOLAX) 17 g packet Take 17 g by mouth as needed for mild constipation.   Yes [provider]  tretinoin (RETIN-A) 0.05 % cream Apply 1 Application topically at bedtime. 11/14/21  Yes [provider]  VITAMIN D PO Take 1 tablet by mouth daily.   Yes [provider]  Vitamin D-Vitamin K  (VITAMIN K2-VITAMIN D3 PO) Take by mouth daily.   Yes [provider]  zinc gluconate 50 MG tablet Take 50 mg by mouth daily.   Yes [provider]  ALPRAZolam (XANAX) 0.25 MG tablet TAKE 1/2 TABLET(0.125 MG) BY MOUTH DAILY AS NEEDED FOR ANXIETY 12/06/21   Worthy Rancher B, FNP  Dapsone 7.5 % GEL Apply topically daily. 09/27/22   [provider]  hydrocortisone (ANUSOL-HC) 2.5 % rectal cream Place rectally 2 (two) times daily as needed for hemorrhoids or anal itching. 09/24/22   Dana Allan, MD  ipratropium (ATROVENT) 0.03 % nasal spray USE 2 SPRAYS IN EACH NOSTRIL THREE TIMES DAILY AS NEEDED FOR RHINITIS 05/31/22   Dana Allan, MD  valACYclovir (VALTREX) 1000 MG tablet Take 2 tablets (2,000 mg total) by mouth 2 (two) times daily as needed. X 1 day outbreak prn 01/03/21   [name], Pasty Spillers, MD    Current Outpatient Medications  Medication Sig Dispense Refill   Ascorbic Acid (VITAMIN C PO) Take 1 tablet by mouth daily.     CALCIUM PO Take 1 tablet by mouth daily.     Cholecalciferol (VITAMIN D) 125 MCG (5000 UT) CAPS Take by mouth daily.     MAGNESIUM PO Take 1 tablet by mouth daily.     polyethylene glycol (MIRALAX / GLYCOLAX) 17  g packet Take 17 g by mouth as needed for mild constipation.     tretinoin (RETIN-A) 0.05 % cream Apply 1 Application topically at bedtime.     VITAMIN D PO Take 1 tablet by mouth daily.     Vitamin D-Vitamin K (VITAMIN K2-VITAMIN D3 PO) Take by mouth daily.     zinc gluconate 50 MG tablet Take 50 mg by mouth daily.     ALPRAZolam (XANAX) 0.25 MG tablet TAKE 1/2 TABLET(0.125 MG) BY MOUTH DAILY AS NEEDED FOR ANXIETY 30 tablet 2   Dapsone 7.5 % GEL Apply topically daily.     hydrocortisone (ANUSOL-HC) 2.5 % rectal cream Place rectally 2 (two) times daily as needed for hemorrhoids or anal itching. 30 g 11   ipratropium (ATROVENT) 0.03 % nasal spray USE 2 SPRAYS IN EACH NOSTRIL THREE TIMES DAILY AS NEEDED FOR RHINITIS 30 mL 5   valACYclovir  (VALTREX) 1000 MG tablet Take 2 tablets (2,000 mg total) by mouth 2 (two) times daily as needed. X 1 day outbreak prn 30 tablet 11   Current Facility-Administered Medications  Medication Dose Route Frequency Provider Last Rate Last Admin   0.9 %  sodium chloride infusion  500 mL Intravenous Once Napoleon Form, MD        Allergies as of 11/02/2022 - Review Complete 11/02/2022  Allergen Reaction Noted   Lactose intolerance (gi) Diarrhea 09/20/2022   Soy allergy Other (See Comments) 11/09/2020   Gluten meal Other (See Comments) 03/02/2020    Family History  Problem Relation Age of Onset   Cancer Mother        breast later in 75s and uterine   Osteoporosis Mother    Colon cancer Father        Dx between age 58-60   Diabetes Father    Heart disease Father    Atrial fibrillation Father    Cancer Brother        colon dx in 65s   Colon cancer Brother        Dx in his 64-50   Breast cancer Maternal Aunt    Osteoporosis Maternal Grandmother    Atrial fibrillation Nephew    Colon polyps Neg Hx    Esophageal cancer Neg Hx    Stomach cancer Neg Hx    Rectal cancer Neg Hx     Social History   Socioeconomic History   Marital status: Married    Spouse name: Not on file   Number of children: 2   Years of education: Not on file   Highest education level: Bachelor's degree (e.g., BA, AB, BS)  Occupational History   Not on file  Tobacco Use   Smoking status: Never   Smokeless tobacco: Never  Vaping Use   Vaping status: Never Used  Substance and Sexual Activity   Alcohol use: Yes    Comment: socially   Drug use: Never   Sexual activity: Yes  Other Topics Concern   Not on file  Social History Narrative   Moved from [place] in 3 or 05/2018 to be near son and grandkids    Never smoker    Exercise yes      DPR Husband Annett Boxwell [phone]   Social Determinants of Health   Financial Resource Strain: Low Risk  (10/19/2022)   Overall Financial Resource Strain  (CARDIA)    Difficulty of Paying Living Expenses: Not hard at all  Food Insecurity: No Food Insecurity (10/19/2022)   Hunger Vital Sign  Worried About Programme researcher, broadcasting/film/video in the Last Year: Never true    Ran Out of Food in the Last Year: Never true  Transportation Needs: No Transportation Needs (10/19/2022)   PRAPARE - Administrator, Civil Service (Medical): No    Lack of Transportation (Non-Medical): No  Physical Activity: Sufficiently Active (10/19/2022)   Exercise Vital Sign    Days of Exercise per Week: 7 days    Minutes of Exercise per Session: 90 min  Stress: No Stress Concern Present (10/19/2022)   [employer] of Occupational Health - Occupational Stress Questionnaire    Feeling of Stress : Not at all  Social Connections: Socially Integrated (10/19/2022)   Social Connection and Isolation Panel [NHANES]    Frequency of Communication with Friends and Family: Three times a week    Frequency of Social Gatherings with Friends and Family: More than three times a week    Attends Religious Services: More than 4 times per year    Active Member of [employer] or Organizations: Yes    Attends Engineer, structural: More than 4 times per year    Marital Status: Married  Catering manager Violence: Not At Risk (10/23/2022)   Humiliation, Afraid, Rape, and Kick questionnaire    Fear of Current or Ex-Partner: No    Emotionally Abused: No    Physically Abused: No    Sexually Abused: No    Review of Systems:  All other review of systems negative except as mentioned in the HPI.  Physical Exam: Vital signs in last 24 hours: BP (!) 140/70   Pulse 81   Temp 98.6 F (37 C) (Temporal)   Resp 13   Ht 5' (1.524 m)   Wt 101 lb (45.8 kg)   SpO2 99%   BMI 19.73 kg/m  General:   Alert, NAD Lungs:  Clear .   Heart:  Regular rate and rhythm Abdomen:  Soft, nontender and nondistended. Neuro/Psych:  Alert and cooperative. Normal mood and affect. A and O x 3  Reviewed labs,  radiology imaging, old records and pertinent past GI work up  Patient is appropriate for planned procedure(s) and anesthesia in an ambulatory setting   K. Scherry Ran , MD [phone]

## 2022-11-02 NOTE — Progress Notes (Signed)
Vitals-SH  Pt's states no medical or surgical changes since previsit or office visit. 

## 2022-11-05 ENCOUNTER — Telehealth: Payer: Self-pay | Admitting: *Deleted

## 2022-11-05 NOTE — Telephone Encounter (Signed)
  Follow up Call-     11/02/2022   10:15 AM 11/02/2022   10:11 AM  Call back number  Post procedure Call Back phone  # [phone]   Permission to leave phone message  Yes     Patient questions:  Do you have a fever, pain , or abdominal swelling? No. Pain Score  0 *  Have you tolerated food without any problems? Yes.    Have you been able to return to your normal activities? Yes.    Do you have any questions about your discharge instructions: Diet   No. Medications  No. Follow up visit  No.  Do you have questions or concerns about your Care? No.  Actions: * If pain score is 4 or above: No action needed, pain <4.

## 2022-11-30 ENCOUNTER — Telehealth: Payer: Self-pay

## 2022-11-30 ENCOUNTER — Other Ambulatory Visit: Payer: Self-pay

## 2022-11-30 ENCOUNTER — Emergency Department: Payer: Medicare Other | Admitting: Registered Nurse

## 2022-11-30 ENCOUNTER — Ambulatory Visit
Admission: EM | Admit: 2022-11-30 | Discharge: 2022-11-30 | Disposition: A | Discharge status: Home / Self Care | Payer: Medicare Other | Attending: Emergency Medicine | Admitting: Emergency Medicine

## 2022-11-30 ENCOUNTER — Emergency Department: Payer: Medicare Other

## 2022-11-30 ENCOUNTER — Encounter: Payer: Self-pay | Admitting: Emergency Medicine

## 2022-11-30 ENCOUNTER — Encounter
Admission: EM | Disposition: A | Discharge status: Home / Self Care | Payer: Self-pay | Source: Home / Self Care | Attending: Emergency Medicine

## 2022-11-30 ENCOUNTER — Telehealth: Payer: Self-pay | Admitting: Family Medicine

## 2022-11-30 DIAGNOSIS — K3533 Acute appendicitis with perforation and localized peritonitis, with abscess: Secondary | ICD-10-CM | POA: Diagnosis not present

## 2022-11-30 DIAGNOSIS — K3580 Unspecified acute appendicitis: Secondary | ICD-10-CM | POA: Diagnosis not present

## 2022-11-30 DIAGNOSIS — K353 Acute appendicitis with localized peritonitis, without perforation or gangrene: Secondary | ICD-10-CM

## 2022-11-30 DIAGNOSIS — R109 Unspecified abdominal pain: Secondary | ICD-10-CM | POA: Diagnosis present

## 2022-11-30 HISTORY — PX: LAPAROSCOPIC APPENDECTOMY: SHX408

## 2022-11-30 LAB — COMPREHENSIVE METABOLIC PANEL
ALT: 20 U/L (ref 0–44)
AST: 21 U/L (ref 15–41)
Albumin: 4.5 g/dL (ref 3.5–5.0)
Alkaline Phosphatase: 93 U/L (ref 38–126)
Anion gap: 12 (ref 5–15)
BUN: 13 mg/dL (ref 8–23)
CO2: 22 mmol/L (ref 22–32)
Calcium: 9.5 mg/dL (ref 8.9–10.3)
Chloride: 105 mmol/L (ref 98–111)
Creatinine, Ser: 0.58 mg/dL (ref 0.44–1.00)
GFR, Estimated: >60 mL/min (ref >60)
Glucose, Bld: 128 mg/dL — ABNORMAL HIGH (ref 70–99)
Potassium: 3.6 mmol/L (ref 3.5–5.1)
Sodium: 139 mmol/L (ref 135–145)
Total Bilirubin: 1.2 mg/dL (ref 0.3–1.2)
Total Protein: 7.4 g/dL (ref 6.5–8.1)

## 2022-11-30 LAB — URINALYSIS, ROUTINE W REFLEX MICROSCOPIC
Bilirubin Urine: NEGATIVE
Glucose, UA: NEGATIVE mg/dL
Hgb urine dipstick: NEGATIVE
Ketones, ur: 80 mg/dL — AB
Leukocytes,Ua: NEGATIVE
Nitrite: NEGATIVE
Protein, ur: NEGATIVE mg/dL
Specific Gravity, Urine: 1.017 (ref 1.005–1.030)
pH: 7 (ref 5.0–8.0)

## 2022-11-30 LAB — CBC
HCT: 41 % (ref 36.0–46.0)
Hemoglobin: 14 g/dL (ref 12.0–15.0)
MCH: 32.1 pg (ref 26.0–34.0)
MCHC: 34.1 g/dL (ref 30.0–36.0)
MCV: 94 fL (ref 80.0–100.0)
Platelets: 215 10*3/uL (ref 150–400)
RBC: 4.36 MIL/uL (ref 3.87–5.11)
RDW: 12.2 % (ref 11.5–15.5)
WBC: 13.3 10*3/uL — ABNORMAL HIGH (ref 4.0–10.5)
nRBC: 0 % (ref 0.0–0.2)

## 2022-11-30 LAB — LIPASE, BLOOD: Lipase: 20 U/L (ref 11–51)

## 2022-11-30 SURGERY — APPENDECTOMY, LAPAROSCOPIC
Anesthesia: General | Site: Abdomen

## 2022-11-30 MED ORDER — PHENYLEPHRINE 80 MCG/ML (10ML) SYRINGE FOR IV PUSH (FOR BLOOD PRESSURE SUPPORT)
PREFILLED_SYRINGE | INTRAVENOUS | Status: AC
  Filled 2022-11-30: qty 10

## 2022-11-30 MED ORDER — LIDOCAINE HCL (PF) 2 % IJ SOLN
INTRAMUSCULAR | Status: AC
  Filled 2022-11-30: qty 5

## 2022-11-30 MED ORDER — FENTANYL CITRATE (PF) 100 MCG/2ML IJ SOLN
INTRAMUSCULAR | Status: AC
  Filled 2022-11-30: qty 2

## 2022-11-30 MED ORDER — BUPIVACAINE-EPINEPHRINE (PF) 0.25% -1:200000 IJ SOLN
INTRAMUSCULAR | Status: DC | PRN
  Administered 2022-11-30: 50 mL

## 2022-11-30 MED ORDER — ONDANSETRON HCL 4 MG/2ML IJ SOLN
INTRAMUSCULAR | Status: DC | PRN
  Administered 2022-11-30: 4 mg via INTRAVENOUS

## 2022-11-30 MED ORDER — PROPOFOL 10 MG/ML IV BOLUS
INTRAVENOUS | Status: AC
  Filled 2022-11-30: qty 20

## 2022-11-30 MED ORDER — ROCURONIUM BROMIDE 10 MG/ML (PF) SYRINGE
PREFILLED_SYRINGE | INTRAVENOUS | Status: AC
  Filled 2022-11-30: qty 10

## 2022-11-30 MED ORDER — SODIUM CHLORIDE 0.9 % IV SOLN: INTRAVENOUS | Status: DC | PRN

## 2022-11-30 MED ORDER — PROPOFOL 10 MG/ML IV BOLUS
INTRAVENOUS | Status: DC | PRN
  Administered 2022-11-30: 80 mg via INTRAVENOUS

## 2022-11-30 MED ORDER — ONDANSETRON HCL 4 MG/2ML IJ SOLN
INTRAMUSCULAR | Status: AC
  Filled 2022-11-30: qty 2

## 2022-11-30 MED ORDER — CHLORHEXIDINE GLUCONATE CLOTH 2 % EX PADS: 6.0000 | MEDICATED_PAD | Freq: Every day | CUTANEOUS | Status: DC

## 2022-11-30 MED ORDER — DEXAMETHASONE SODIUM PHOSPHATE 10 MG/ML IJ SOLN
INTRAMUSCULAR | Status: DC | PRN
  Administered 2022-11-30: 5 mg via INTRAVENOUS

## 2022-11-30 MED ORDER — OXYCODONE HCL 5 MG PO TABS
ORAL_TABLET | ORAL | Status: AC
  Filled 2022-11-30: qty 1

## 2022-11-30 MED ORDER — BUPIVACAINE-EPINEPHRINE (PF) 0.25% -1:200000 IJ SOLN
INTRAMUSCULAR | Status: AC
  Filled 2022-11-30: qty 30

## 2022-11-30 MED ORDER — DROPERIDOL 2.5 MG/ML IJ SOLN: 0.6250 mg | Freq: Once | INTRAMUSCULAR | Status: DC | PRN

## 2022-11-30 MED ORDER — OXYCODONE HCL 5 MG PO TABS
5.0000 mg | ORAL_TABLET | Freq: Once | ORAL | Status: AC
  Administered 2022-11-30: 5 mg via ORAL

## 2022-11-30 MED ORDER — 0.9 % SODIUM CHLORIDE (POUR BTL) OPTIME
TOPICAL | Status: DC | PRN
  Administered 2022-11-30: 500 mL

## 2022-11-30 MED ORDER — SODIUM CHLORIDE 0.9 % IR SOLN
Status: DC | PRN
  Administered 2022-11-30: 500 mL

## 2022-11-30 MED ORDER — IOHEXOL 300 MG/ML  SOLN
75.0000 mL | Freq: Once | INTRAMUSCULAR | Status: AC | PRN
Start: 2022-11-30 — End: 2022-11-30
  Administered 2022-11-30: 75 mL via INTRAVENOUS

## 2022-11-30 MED ORDER — FENTANYL CITRATE (PF) 100 MCG/2ML IJ SOLN
INTRAMUSCULAR | Status: DC | PRN
  Administered 2022-11-30: 50 ug via INTRAVENOUS
  Administered 2022-11-30: 25 ug via INTRAVENOUS

## 2022-11-30 MED ORDER — SUCCINYLCHOLINE CHLORIDE 200 MG/10ML IV SOSY
PREFILLED_SYRINGE | INTRAVENOUS | Status: DC | PRN
  Administered 2022-11-30: 60 mg via INTRAVENOUS

## 2022-11-30 MED ORDER — MIDAZOLAM HCL 2 MG/2ML IJ SOLN
INTRAMUSCULAR | Status: DC | PRN
  Administered 2022-11-30: 2 mg via INTRAVENOUS

## 2022-11-30 MED ORDER — SODIUM CHLORIDE 0.9 % IV BOLUS
1000.0000 mL | Freq: Once | INTRAVENOUS | Status: AC
  Administered 2022-11-30: 1000 mL via INTRAVENOUS

## 2022-11-30 MED ORDER — DEXAMETHASONE SODIUM PHOSPHATE 10 MG/ML IJ SOLN
INTRAMUSCULAR | Status: AC
  Filled 2022-11-30: qty 1

## 2022-11-30 MED ORDER — ROCURONIUM BROMIDE 100 MG/10ML IV SOLN
INTRAVENOUS | Status: DC | PRN
  Administered 2022-11-30: 30 mg via INTRAVENOUS

## 2022-11-30 MED ORDER — BUPIVACAINE LIPOSOME 1.3 % IJ SUSP
INTRAMUSCULAR | Status: AC
  Filled 2022-11-30: qty 20

## 2022-11-30 MED ORDER — FENTANYL CITRATE (PF) 100 MCG/2ML IJ SOLN
25.0000 ug | INTRAMUSCULAR | Status: DC | PRN
Start: 2022-11-30 — End: 2022-11-30

## 2022-11-30 MED ORDER — HYDROCODONE-ACETAMINOPHEN 5-325 MG PO TABS: 1.0000 | ORAL_TABLET | ORAL | 0 refills | Status: DC | PRN

## 2022-11-30 MED ORDER — LIDOCAINE HCL (CARDIAC) PF 100 MG/5ML IV SOSY
PREFILLED_SYRINGE | INTRAVENOUS | Status: DC | PRN
  Administered 2022-11-30: 60 mg via INTRAVENOUS

## 2022-11-30 MED ORDER — SUCCINYLCHOLINE CHLORIDE 200 MG/10ML IV SOSY
PREFILLED_SYRINGE | INTRAVENOUS | Status: AC
  Filled 2022-11-30: qty 10

## 2022-11-30 MED ORDER — SUGAMMADEX SODIUM 200 MG/2ML IV SOLN
INTRAVENOUS | Status: DC | PRN
  Administered 2022-11-30: 100 mg via INTRAVENOUS

## 2022-11-30 MED ORDER — PIPERACILLIN-TAZOBACTAM 3.375 G IVPB 30 MIN
3.3750 g | Freq: Once | INTRAVENOUS | Status: AC
  Administered 2022-11-30: 3.375 g via INTRAVENOUS
  Filled 2022-11-30: qty 50

## 2022-11-30 MED ORDER — PHENYLEPHRINE 80 MCG/ML (10ML) SYRINGE FOR IV PUSH (FOR BLOOD PRESSURE SUPPORT)
PREFILLED_SYRINGE | INTRAVENOUS | Status: DC | PRN
  Administered 2022-11-30: 80 ug via INTRAVENOUS
  Administered 2022-11-30: 160 ug via INTRAVENOUS

## 2022-11-30 SURGICAL SUPPLY — 44 items
ADH SKN CLS APL DERMABOND .7 (GAUZE/BANDAGES/DRESSINGS) ×1
APPLIER CLIP 5 13 M/L LIGAMAX5 (MISCELLANEOUS)
APR CLP MED LRG 5 ANG JAW (MISCELLANEOUS)
BLADE CLIPPER SURG (BLADE) ×1 IMPLANT
CLIP APPLIE 5 13 M/L LIGAMAX5 (MISCELLANEOUS) IMPLANT
CUTTER FLEX LINEAR 45M (STAPLE) ×1 IMPLANT
DERMABOND ADVANCED .7 DNX12 (GAUZE/BANDAGES/DRESSINGS) ×1 IMPLANT
ELECT CAUTERY BLADE 6.4 (BLADE) ×1 IMPLANT
ELECT CAUTERY BLADE TIP 2.5 (TIP) ×1
ELECT REM PT RETURN 9FT ADLT (ELECTROSURGICAL) ×1
ELECTRODE CAUTERY BLDE TIP 2.5 (TIP) ×1 IMPLANT
ELECTRODE REM PT RTRN 9FT ADLT (ELECTROSURGICAL) ×1 IMPLANT
GLOVE BIO SURGEON STRL SZ7 (GLOVE) ×1 IMPLANT
GOWN STRL REUS W/ TWL LRG LVL3 (GOWN DISPOSABLE) ×2 IMPLANT
GOWN STRL REUS W/TWL LRG LVL3 (GOWN DISPOSABLE) ×2
IRRIGATION STRYKERFLOW (MISCELLANEOUS) ×1 IMPLANT
IRRIGATOR STRYKERFLOW (MISCELLANEOUS) ×1
IV NS 1000ML (IV SOLUTION) ×1
IV NS 1000ML BAXH (IV SOLUTION) ×1 IMPLANT
MANIFOLD NEPTUNE II (INSTRUMENTS) ×1 IMPLANT
NDL HYPO 22X1.5 SAFETY MO (MISCELLANEOUS) ×1 IMPLANT
NEEDLE HYPO 22X1.5 SAFETY MO (MISCELLANEOUS) ×1 IMPLANT
NS IRRIG 500ML POUR BTL (IV SOLUTION) ×1 IMPLANT
PACK LAP CHOLECYSTECTOMY (MISCELLANEOUS) ×1 IMPLANT
PENCIL SMOKE EVACUATOR (MISCELLANEOUS) ×1 IMPLANT
RELOAD 45 VASCULAR/THIN (ENDOMECHANICALS) ×1 IMPLANT
RELOAD STAPLE 45 2.5 WHT GRN (ENDOMECHANICALS) IMPLANT
RELOAD STAPLE 45 3.5 BLU ETS (ENDOMECHANICALS) ×1 IMPLANT
RELOAD STAPLE TA45 3.5 REG BLU (ENDOMECHANICALS) IMPLANT
SCISSORS METZENBAUM CVD 33 (INSTRUMENTS) IMPLANT
SET TUBE SMOKE EVAC HIGH FLOW (TUBING) ×1 IMPLANT
SHEARS HARMONIC 36 ACE (MISCELLANEOUS) ×1 IMPLANT
SLEEVE Z-THREAD 5X100MM (TROCAR) ×1 IMPLANT
SPONGE T-LAP 18X18 ~~LOC~~+RFID (SPONGE) ×1 IMPLANT
SUT MNCRL AB 4-0 PS2 18 (SUTURE) ×1 IMPLANT
SUT VICRYL 0 UR6 27IN ABS (SUTURE) ×2 IMPLANT
SYR 20ML LL LF (SYRINGE) ×1 IMPLANT
SYS BAG RETRIEVAL 10MM (BASKET) ×1
SYSTEM BAG RETRIEVAL 10MM (BASKET) ×1 IMPLANT
TRAP FLUID SMOKE EVACUATOR (MISCELLANEOUS) ×1 IMPLANT
TRAY FOLEY MTR SLVR 16FR STAT (SET/KITS/TRAYS/PACK) ×1 IMPLANT
TROCAR BALLN 12MMX100 BLUNT (TROCAR) ×1 IMPLANT
TROCAR Z-THREAD FIOS 5X100MM (TROCAR) ×1 IMPLANT
WATER STERILE IRR 500ML POUR (IV SOLUTION) ×1 IMPLANT

## 2022-11-30 NOTE — Telephone Encounter (Signed)
  Symptoms:  Dry heat, severe pain in belly and back. On both sides. Cold sweats and very weak. Can't keep anything down.    Attributing factors (medication changes, positional changes, etc. ) No changes in medication     Duration :     Pain Scale?  On 1-10 how woiuld you rate your pain? What makes it better or worse?  10     Blood pressure            Pulse             Temp     I triaged patient. No appointments available today

## 2022-11-30 NOTE — ED Notes (Signed)
Report given to [facility] in Same Day surgery.

## 2022-11-30 NOTE — Anesthesia Preprocedure Evaluation (Signed)
Anesthesia Evaluation  Patient identified by MRN, date of birth, ID band Patient awake    Reviewed: Allergy & Precautions, H&P , NPO status , Patient's Chart, lab work & pertinent test results, reviewed documented beta blocker date and time   History of Anesthesia Complications Negative for: history of anesthetic complications  Airway Mallampati: III  TM Distance: >3 FB Neck ROM: full    Dental  (+) Implants, Dental Advidsory Given, Caps, Teeth Intact   Pulmonary neg pulmonary ROS, Continuous Positive Airway Pressure Ventilation    Pulmonary exam normal breath sounds clear to auscultation       Cardiovascular Exercise Tolerance: Good (-) hypertension(-) angina (-) Past MI and (-) Cardiac Stents Normal cardiovascular exam(-) dysrhythmias + Valvular Problems/Murmurs  Rhythm:regular Rate:Normal     Neuro/Psych  PSYCHIATRIC DISORDERS Anxiety     negative neurological ROS     GI/Hepatic negative GI ROS, Neg liver ROS,,,  Endo/Other  negative endocrine ROS    Renal/GU negative Renal ROS  negative genitourinary   Musculoskeletal   Abdominal   Peds  Hematology negative hematology ROS (+)   Anesthesia Other Findings Past Medical History: No date: Anemia No date: Anxiety     Comment:  did not like the way ssri made her feel ? name 07/11/2021: Bowel habit changes No date: Cataract No date: Colon polyps 01/18/2021: COVID-19 No date: Hemorrhoids No date: Hemorrhoids No date: Herpes     Comment:  oral 09/17/2018: Insomnia No date: Osteoporosis No date: Shingles     Comment:  right back/flank remotely lasting 9 months of pain was               on gabapentin No date: Tinnitus   Reproductive/Obstetrics negative OB ROS                             Anesthesia Physical Anesthesia Plan  ASA: 2  Anesthesia Plan: General   Post-op Pain Management:    Induction: Intravenous, Rapid sequence and  Cricoid pressure planned  PONV Risk Score and Plan: 3 and Ondansetron, Dexamethasone and Treatment may vary due to age or medical condition  Airway Management Planned: Oral ETT  Additional Equipment:   Intra-op Plan:   Post-operative Plan: Extubation in OR  Informed Consent: I have reviewed the patients History and Physical, chart, labs and discussed the procedure including the risks, benefits and alternatives for the proposed anesthesia with the patient or authorized representative who has indicated his/her understanding and acceptance.     Dental Advisory Given  Plan Discussed with: Anesthesiologist, CRNA and Surgeon  Anesthesia Plan Comments:        Anesthesia Quick Evaluation

## 2022-11-30 NOTE — ED Notes (Signed)
Patient's husband states he is taking patient's belongings with him. OR transporter at bedside.

## 2022-11-30 NOTE — Op Note (Signed)
laparascopic appendectomy   Beverly Lowe Date of operation:  11/30/2022  Indications: The patient presented with a history of  abdominal pain. Workup has revealed findings consistent with acute appendicitis.  Pre-operative Diagnosis: Acute appendicitis without mention of peritonitis  Post-operative Diagnosis: Same  Surgeon: Sterling Big, MD, FACS  Anesthesia: General with endotracheal tube  Findings: Acute appendicitis w dilated appendix  Estimated Blood Loss: 5cc         Specimens: appendix         Complications:  none  Procedure Details  The patient was seen again in the preop area. The options of surgery versus observation were reviewed with the patient and/or family. The risks of bleeding, infection, recurrence of symptoms, negative laparoscopy, potential for an open procedure, bowel injury, abscess or infection, were all reviewed as well. The patient was taken to Operating Room, identified as Beverly Lowe and the procedure verified as laparoscopic appendectomy. A Time Out was held and the above information confirmed.  The patient was placed in the supine position and general anesthesia was induced.  Antibiotic prophylaxis was administered and VT E prophylaxis was in place.    The abdomen was prepped and draped in a sterile fashion. An infraumbilical incision was made. A cutdown technique was used to enter the abdominal cavity. Two vicryl stitches were placed on the fascia and a Hasson trocar inserted. Pneumoperitoneum obtained. Two 5 mm ports were placed under direct visualization.   The appendix was identified and found to be acutely inflamed  The appendix was carefully dissected. The mesoappendix was divided withHarmonic scalpel. The base of the appendix was dissected out and divided with a white load Endo GIA.The appendix was placed in a Endo Catch bag and removed via the Hasson port. The right lower quadrant and pelvis was then irrigated with  normal saline which was  aspirated. Inspection  failed to identify any additional bleeding and there were no signs of bowel injury. Again the right lower quadrant was inspected there was no sign of bleeding or bowel injury therefore pneumoperitoneum was released, all ports were removed.  The umbilical fascia was closed with 0 Vicryl interrupted sutures and the skin incisions were approximated with subcuticular 4-0 Monocryl. Dermabond was placed The patient tolerated the procedure well, there were no complications. The sponge lap and needle count were correct at the end of the procedure.  The patient was taken to the recovery room in stable condition to be admitted for continued care.    Sterling Big, MD FACS

## 2022-11-30 NOTE — Consult Note (Signed)
Patient ID: Beverly Lowe, female   DOB: 11-26-1945, 77 y.o.   MRN: [id number]  HPI Beverly Lowe is a 77 y.o. female seen in consultation at the request of Ms. Ivan Anchors PA-C.  She presents with an acute onset of abdominal pain since early in the morning.  Initially periumbilical and then located to the right lower quadrant and lower abdomen.  The pain is intermittent sharp and was severe at some point.  It subsided with some medicines for pain.  She also had associated nausea and vomiting.  No fevers no chills.  She also endorses decreased appetite. She did have a CT scan of the abdomen pelvis that I have personally reviewed showing evidence of a dilated appendix with appendicolith.  No free air no evidence of perforation or abscess.  She did have a white count of 13,000 otherwise all lab work is unremarkable. She has never had any operation.  She is able to perform more than 4 METS of activity without any shortness of breath or chest pain.  She is very healthy considering her age.  HPI  Past Medical History:  Diagnosis Date   Anemia    Anxiety    did not like the way ssri made her feel ? name   Bowel habit changes 07/11/2021   Cataract    Colon polyps    COVID-19 01/18/2021   Hemorrhoids    Hemorrhoids    Herpes    oral   Insomnia 09/17/2018   Osteoporosis    Shingles    right back/flank remotely lasting 9 months of pain was on gabapentin   Tinnitus     Past Surgical History:  Procedure Laterality Date   COLONOSCOPY     dental implants     NASAL SEPTUM SURGERY     15 years ago    Family History  Problem Relation Age of Onset   Cancer Mother        breast later in 6s and uterine   Osteoporosis Mother    Colon cancer Father        Dx between age 31-60   Diabetes Father    Heart disease Father    Atrial fibrillation Father    Cancer Brother        colon dx in 73s   Colon cancer Brother        Dx in his 25-50   Breast cancer Maternal Aunt    Osteoporosis Maternal  Grandmother    Atrial fibrillation Nephew    Colon polyps Neg Hx    Esophageal cancer Neg Hx    Stomach cancer Neg Hx    Rectal cancer Neg Hx     Social History Social History   Tobacco Use   Smoking status: Never   Smokeless tobacco: Never  Vaping Use   Vaping status: Never Used  Substance Use Topics   Alcohol use: Yes    Comment: socially   Drug use: Never    Allergies  Allergen Reactions   Lactose Intolerance (Gi) Diarrhea   Soy Allergy Other (See Comments)    Bloating , cramps   Gluten Meal Other (See Comments)    Bloating and abdominal cramps    Current Facility-Administered Medications  Medication Dose Route Frequency Provider Last Rate Last Admin   [START ON 12/01/2022] Chlorhexidine Gluconate Cloth 2 % PADS 6 each  6 each Topical Q0600 Vallarie Fei F, MD       piperacillin-tazobactam (ZOSYN) IVPB 3.375 g  3.375 g Intravenous Once Gwendolyne Welford,  Racheal Mathurin F, MD       sodium chloride 0.9 % bolus 1,000 mL  1,000 mL Intravenous Once Aryka Coonradt F, MD       Current Outpatient Medications  Medication Sig Dispense Refill   ALPRAZolam (XANAX) 0.25 MG tablet TAKE 1/2 TABLET(0.125 MG) BY MOUTH DAILY AS NEEDED FOR ANXIETY 30 tablet 2   Ascorbic Acid (VITAMIN C PO) Take 1 tablet by mouth daily.     CALCIUM PO Take 1 tablet by mouth daily.     Cholecalciferol (VITAMIN D) 125 MCG (5000 UT) CAPS Take by mouth daily.     Dapsone 7.5 % GEL Apply topically daily.     hydrocortisone (ANUSOL-HC) 2.5 % rectal cream Place rectally 2 (two) times daily as needed for hemorrhoids or anal itching. 30 g 11   ipratropium (ATROVENT) 0.03 % nasal spray USE 2 SPRAYS IN EACH NOSTRIL THREE TIMES DAILY AS NEEDED FOR RHINITIS 30 mL 5   MAGNESIUM PO Take 1 tablet by mouth daily.     polyethylene glycol (MIRALAX / GLYCOLAX) 17 g packet Take 17 g by mouth as needed for mild constipation.     tretinoin (RETIN-A) 0.05 % cream Apply 1 Application topically at bedtime.     valACYclovir (VALTREX) 1000 MG tablet  Take 2 tablets (2,000 mg total) by mouth 2 (two) times daily as needed. X 1 day outbreak prn 30 tablet 11   VITAMIN D PO Take 1 tablet by mouth daily.     Vitamin D-Vitamin K (VITAMIN K2-VITAMIN D3 PO) Take by mouth daily.     zinc gluconate 50 MG tablet Take 50 mg by mouth daily.       Review of Systems Full ROS  was asked and was negative except for the information on the HPI  Physical Exam Blood pressure (!) 142/76, pulse 68, temperature 98.5 F (36.9 C), temperature source Oral, resp. rate 17, height 5' (1.524 m), weight 46.7 kg, SpO2 97%. CONSTITUTIONAL: NAD. EYES: Pupils are equal, round, there is an appendicolith there is tender is not classic but she does have a sclera are non-icteric. EARS, NOSE, MOUTH AND THROAT: The oropharynx is clear. The oral mucosa is pink and moist. Hearing is intact to voice. LYMPH NODES:  Lymph nodes in the neck are normal. RESPIRATORY:  Lungs are clear. There is normal respiratory effort, with equal breath sounds bilaterally, and without pathologic use of accessory muscles. CARDIOVASCULAR: Heart is regular without murmurs, gallops, or rubs. GI: The abdomen is  soft, tender to palpation in the right lower quadrant without overt peritonitis there are no palpable masses. There is no hepatosplenomegaly. There are normal bowel sounds GU: Rectal deferred.   MUSCULOSKELETAL: Normal muscle strength and tone. No cyanosis or edema.   SKIN: Turgor is good and there are no pathologic skin lesions or ulcers. NEUROLOGIC: Motor and sensation is grossly normal. Cranial nerves are grossly intact. PSYCH:  Oriented to person, place and time. Affect is normal.  Data Reviewed  I have personally reviewed the patient's imaging, laboratory findings and medical records.    Assessment/Plan 77 year old female with acute abdominal pain consistent with appendicitis.  Discussed with the patient in detail about her disease process. Given that there is appendicolith and her age I  do recommend prompt appendectomy. The risks, benefits, complications, treatment options, and expected outcomes were discussed with the patient.  Also discussed continuing to the operating room for Laparoscopic Appendectomy.  The possibilities of  bleeding, recurrent infection, perforation of viscus, finding a normal appendix,  the need for additional procedures, failure to diagnose a condition, conversion to open procedure and creating a complication requiring transfusion or further operations were discussed. The patient was given the opportunity to ask questions and have them answered.  Patient would like to proceed with Laparoscopic Appendectomy and consent was obtained.  We Will also start broad-spectrum antibiotics, IV fluids.  We talked about recovery restrictions in detail.  All questions were answered   Sterling Big, MD FACS General Surgeon 11/30/2022, 2:45 PM

## 2022-11-30 NOTE — Anesthesia Procedure Notes (Signed)
Procedure Name: Intubation Date/Time: 11/30/2022 4:04 PM  Performed by: Elmarie Mainland, CRNAPre-anesthesia Checklist: Patient identified, Emergency Drugs available, Suction available and Patient being monitored Patient Re-evaluated:Patient Re-evaluated prior to induction Oxygen Delivery Method: Circle system utilized Preoxygenation: Pre-oxygenation with 100% oxygen Induction Type: IV induction, Rapid sequence and Cricoid Pressure applied Laryngoscope Size: McGraph and 3 Grade View: Grade I Tube type: Oral Tube size: 6.5 mm Number of attempts: 1 Airway Equipment and Method: Stylet and Video-laryngoscopy Placement Confirmation: ETT inserted through vocal cords under direct vision, positive ETCO2 and breath sounds checked- equal and bilateral Secured at: 21 cm Tube secured with: Tape Dental Injury: Teeth and Oropharynx as per pre-operative assessment

## 2022-11-30 NOTE — Telephone Encounter (Signed)
Access Nurse was called. I transferred the patient to access nurse.

## 2022-11-30 NOTE — ED Triage Notes (Signed)
Pt sts that she is having abd pain since this morning at 0400.

## 2022-11-30 NOTE — H&P (Addendum)
Patient ID: Beverly Lowe, female   DOB: 12/31/1945, 77 y.o.   MRN: [id number]   HPI Beverly Lowe is a 77 y.o. female seen in consultation at the request of Ms. Poggi G PA-C.  She presents with an acute onset of abdominal pain since early in the morning.  Initially periumbilical and then located to the right lower quadrant and lower abdomen.  The pain is intermittent sharp and was severe at some point.  It subsided with some medicines for pain.  She also had associated nausea and vomiting.  No fevers no chills.  She also endorses decreased appetite. She did have a CT scan of the abdomen pelvis that I have personally reviewed showing evidence of a dilated appendix with appendicolith.  No free air no evidence of perforation or abscess.  She did have a white count of 13,000 otherwise all lab work is unremarkable. She has never had any operation.  She is able to perform more than 4 METS of activity without any shortness of breath or chest pain.  She is very healthy considering her age.   HPI       Past Medical History:  Diagnosis Date   Anemia     Anxiety      did not like the way ssri made her feel ? name   Bowel habit changes 07/11/2021   Cataract     Colon polyps     COVID-19 01/18/2021   Hemorrhoids     Hemorrhoids     Herpes      oral   Insomnia 09/17/2018   Osteoporosis     Shingles      right back/flank remotely lasting 9 months of pain was on gabapentin   Tinnitus                 Past Surgical History:  Procedure Laterality Date   COLONOSCOPY       dental implants       NASAL SEPTUM SURGERY        15 years ago               Family History  Problem Relation Age of Onset   Cancer Mother          breast later in 13s and uterine   Osteoporosis Mother     Colon cancer Father          Dx between age 23-60   Diabetes Father     Heart disease Father     Atrial fibrillation Father     Cancer Brother          colon dx in 73s   Colon cancer Brother          Dx  in his 11-50   Breast cancer Maternal Aunt     Osteoporosis Maternal Grandmother     Atrial fibrillation Nephew     Colon polyps Neg Hx     Esophageal cancer Neg Hx     Stomach cancer Neg Hx     Rectal cancer Neg Hx            Social History Social History  Social History         Tobacco Use   Smoking status: Never   Smokeless tobacco: Never  Vaping Use   Vaping status: Never Used  Substance Use Topics   Alcohol use: Yes      Comment: socially   Drug use: Never        Allergies  Allergies  Allergen Reactions   Lactose Intolerance (Gi) Diarrhea   Soy Allergy Other (See Comments)      Bloating , cramps   Gluten Meal Other (See Comments)      Bloating and abdominal cramps                 Current Facility-Administered Medications  Medication Dose Route Frequency Provider Last Rate Last Admin   [START ON 12/01/2022] Chlorhexidine Gluconate Cloth 2 % PADS 6 each  6 each Topical Q0600 Rusell Meneely F, MD       piperacillin-tazobactam (ZOSYN) IVPB 3.375 g  3.375 g Intravenous Once Mylo Driskill F, MD       sodium chloride 0.9 % bolus 1,000 mL  1,000 mL Intravenous Once Kailan Laws F, MD                Current Outpatient Medications  Medication Sig Dispense Refill   ALPRAZolam (XANAX) 0.25 MG tablet TAKE 1/2 TABLET(0.125 MG) BY MOUTH DAILY AS NEEDED FOR ANXIETY 30 tablet 2   Ascorbic Acid (VITAMIN C PO) Take 1 tablet by mouth daily.       CALCIUM PO Take 1 tablet by mouth daily.       Cholecalciferol (VITAMIN D) 125 MCG (5000 UT) CAPS Take by mouth daily.       Dapsone 7.5 % GEL Apply topically daily.       hydrocortisone (ANUSOL-HC) 2.5 % rectal cream Place rectally 2 (two) times daily as needed for hemorrhoids or anal itching. 30 g 11   ipratropium (ATROVENT) 0.03 % nasal spray USE 2 SPRAYS IN EACH NOSTRIL THREE TIMES DAILY AS NEEDED FOR RHINITIS 30 mL 5   MAGNESIUM PO Take 1 tablet by mouth daily.       polyethylene glycol (MIRALAX / GLYCOLAX) 17 g packet  Take 17 g by mouth as needed for mild constipation.       tretinoin (RETIN-A) 0.05 % cream Apply 1 Application topically at bedtime.       valACYclovir (VALTREX) 1000 MG tablet Take 2 tablets (2,000 mg total) by mouth 2 (two) times daily as needed. X 1 day outbreak prn 30 tablet 11   VITAMIN D PO Take 1 tablet by mouth daily.       Vitamin D-Vitamin K (VITAMIN K2-VITAMIN D3 PO) Take by mouth daily.       zinc gluconate 50 MG tablet Take 50 mg by mouth daily.              Review of Systems Full ROS  was asked and was negative except for the information on the HPI   Physical Exam Blood pressure (!) 142/76, pulse 68, temperature 98.5 F (36.9 C), temperature source Oral, resp. rate 17, height 5' (1.524 m), weight 46.7 kg, SpO2 97%. CONSTITUTIONAL: NAD. EYES: Pupils are equal, round, there is an appendicolith there is tender is not classic but she does have a sclera are non-icteric. EARS, NOSE, MOUTH AND THROAT: The oropharynx is clear. The oral mucosa is pink and moist. Hearing is intact to voice. LYMPH NODES:  Lymph nodes in the neck are normal. RESPIRATORY:  Lungs are clear. There is normal respiratory effort, with equal breath sounds bilaterally, and without pathologic use of accessory muscles. CARDIOVASCULAR: Heart is regular without murmurs, gallops, or rubs. GI: The abdomen is  soft, tender to palpation in the right lower quadrant without overt peritonitis there are no palpable masses. There is no hepatosplenomegaly. There are normal bowel sounds GU: Rectal deferred.  MUSCULOSKELETAL: Normal muscle strength and tone. No cyanosis or edema.   SKIN: Turgor is good and there are no pathologic skin lesions or ulcers. NEUROLOGIC: Motor and sensation is grossly normal. Cranial nerves are grossly intact. PSYCH:  Oriented to person, place and time. Affect is normal.   Data Reviewed   I have personally reviewed the patient's imaging, laboratory findings and medical records.      Assessment/Plan 76 year old female with acute abdominal pain consistent with appendicitis.  Discussed with the patient in detail about her disease process. Given that there is appendicolith and her age I do recommend prompt appendectomy. The risks, benefits, complications, treatment options, and expected outcomes were discussed with the patient.  Also discussed continuing to the operating room for Laparoscopic Appendectomy.  The possibilities of  bleeding, recurrent infection, perforation of viscus, finding a normal appendix, the need for additional procedures, failure to diagnose a condition, conversion to open procedure and creating a complication requiring transfusion or further operations were discussed. The patient was given the opportunity to ask questions and have them answered.  Patient would like to proceed with Laparoscopic Appendectomy and consent was obtained.  We Will also start broad-spectrum antibiotics, IV fluids.  We talked about recovery restrictions in detail.  All questions were answered     Sterling Big, MD FACS General Surgeon 11/30/2022, 2:45 PM

## 2022-11-30 NOTE — Transfer of Care (Signed)
Immediate Anesthesia Transfer of Care Note  Patient: Beverly Lowe  Procedure(s) Performed: APPENDECTOMY LAPAROSCOPIC (Abdomen)  Patient Location: PACU  Anesthesia Type:General  Level of Consciousness: awake, drowsy, and patient cooperative  Airway & Oxygen Therapy: Patient Spontanous Breathing and Patient connected to face mask oxygen  Post-op Assessment: Report given to RN and Post -op Vital signs reviewed and stable  Post vital signs: Reviewed and stable  Last Vitals:  Vitals Value Taken Time  BP 113/66 11/30/22 1702  Temp    Pulse 88 11/30/22 1704  Resp 20 11/30/22 1704  SpO2 100 % 11/30/22 1704  Vitals shown include unfiled device data.  Last Pain:  Vitals:   11/30/22 1541  TempSrc: Temporal  PainSc: 0-No pain         Complications: No notable events documented.

## 2022-11-30 NOTE — Discharge Instructions (Addendum)
AMBULATORY SURGERY  DISCHARGE INSTRUCTIONS   The drugs that you were given will stay in your system until tomorrow so for the next 24 hours you should not:  Drive an automobile Make any legal decisions Drink any alcoholic beverage   You may resume regular meals tomorrow.  Today it is better to start with liquids and gradually work up to solid foods.  You may eat anything you prefer, but it is better to start with liquids, then soup and crackers, and gradually work up to solid foods.   Please notify your doctor immediately if you have any unusual bleeding, trouble breathing, redness and pain at the surgery site, drainage, fever, or pain not relieved by medication.    Additional Instructions:        Please contact your physician with any problems or Same Day Surgery at [phone], Monday through Friday 6 am to 4 pm, or [place] at [facility] number at [phone].Laparoscopic Appendectomy, Adult, Care After The following information offers guidance on how to care for yourself after your procedure. Your health care provider may also give you more specific instructions. If you have problems or questions, contact your health care provider. What can I expect after the procedure? After the procedure, it is common to have: Tiredness (fatigue). Mild pain in the incision areas. Constipation. This may be caused by taking pain medicines and being less active during recovery. Gas pain that can radiate to your shoulders. Follow these instructions at home: Medicines Take over-the-counter and prescription medicines only as told by your health care provider. Finish all antibiotic medicine even if you start to feel better. Take pain medicines before your pain becomes severe. Ask your health care provider if your condition or your medicine: Requires you to avoid driving or using machinery. Can cause constipation. You may need to take these actions to prevent or treat  constipation: Drink enough fluid to keep your urine pale yellow. Take over-the-counter or prescription medicines. Eat foods that are high in fiber, such as beans, whole grains, and fresh fruits and vegetables. Limit foods that are high in fat and processed sugars, such as fried or sweet foods. Incision care  Follow instructions from your health care provider about how to take care of your incisions. Make sure you: Wash your hands with soap and water for at least 20 seconds before and after you change your bandage (dressing). If soap and water are not available, use hand sanitizer. Change your dressing as told by your health care provider. Leave stitches (sutures), staples, skin glue, or adhesive strips in place. These skin closures may need to stay in place for 2 weeks or longer. If adhesive strip edges start to loosen and curl up, you may trim the loose edges. Do not remove adhesive strips completely unless your health care provider tells you to do that. Check your incision areas every day for signs of infection. Check for: Redness, swelling, or pain. Fluid or blood. Warmth. Pus or a bad smell. Bathing Do not take baths, swim, or use a hot tub until your health care provider approves. Ask your health care provider if you may take showers. You may only be allowed to take sponge baths. Keep your incisions clean and dry. Clean them as often as told by your health care provider. To do this: Gently wash the incisions with soap and water. Rinse the incisions with water to remove all soap. Pat the incisions dry with a clean towel. Do not rub the incisions. Activity  If you were given a sedative during the procedure, it can affect you for several hours. Do not drive or operate machinery until your health care provider says that it is safe. Rest as told by your health care provider. Do not lift anything that is heavier than 10 lb (4.5 kg), or the limit that you are told, until your health care  provider says that it is safe. Do not play contact sports until your health care provider tells you that it is safe to do so. Return to your normal activities as told by your health care provider. Ask your health care provider what activities are safe for you. General instructions If you were sent home with a drain, follow instructions from your health care provider about how to care for it. Take deep breaths. This helps to prevent your lungs from developing an infection (pneumonia). If you need to cough or sneeze, place a pillow or blanket on your abdomen before you do so. This will help you control the pain. Keep all follow-up visits. This is important. Contact a health care provider if: You have redness, swelling, or pain around an incision. You have fluid or blood coming from an incision. An incision feels warm to the touch. You have pus or a bad smell coming from an incision or dressing. The edges of an incision break open after your sutures have been removed. You lose your appetite, keep feeling nauseous, or you are vomiting. You have diarrhea, or you cannot control your bowel functions. You develop a rash. Get help right away if: You have a fever. You have difficulty breathing. You have sharp pains in your chest. You develop swelling or pain in your legs. You faint. These symptoms may be an emergency. Get help right away. Call 911. Do not wait to see if the symptoms will go away. Do not drive yourself to the hospital. Summary After a laparoscopic appendectomy, it is common to have tiredness, mild pain in the area of the incisions, constipation, and gas pain. Follow your health care provider's instructions about caring for yourself after the procedure. Rest after the procedure. Return to your normal activities as told by your health care provider. Contact your health care provider if you notice signs of infection around your incisions. Get help right away if you develop a fever,  chest pain, or difficulty breathing. This information is not intended to replace advice given to you by your health care provider. Make sure you discuss any questions you have with your health care provider. Document Revised: 11/03/2020 Document Reviewed: 11/03/2020 Elsevier Patient Education  2024 [employer].

## 2022-11-30 NOTE — ED Provider Notes (Signed)
[facility] Provider Note    Event Date/Time   First MD Initiated Contact with Patient 11/30/22 1235     (approximate)   History   Abdominal Pain   HPI  Beverly Lowe is a 77 y.o. female who presents today for evaluation of right lower quadrant abdominal pain.  Patient reports that this awoke her from sleep at approximately 4 AM.  It was associated with nausea and dry heaving, though she did not have any vomiting.  She denie black or bloody stool.  She has not had any fever or chills.  She denies history of abdominal surgery.  No urinary symptoms.  She has not taken anything for her symptoms.  Patient Active Problem List   Diagnosis Date Noted   Acute appendicitis with localized peritonitis, without perforation, abscess, or gangrene 11/30/2022   Annual physical exam 09/20/2022   Vitamin D deficiency 07/22/2022   History of colon polyps 07/22/2022   Chronic use of benzodiazepine for therapeutic purpose 07/22/2022   Breast cancer screening by mammogram 07/22/2022   Hearing loss of left ear 01/03/2021   Mitral valve insufficiency 01/03/2021   Aortic valve regurgitation 01/03/2021   Aortic dilatation (HCC) 01/03/2021   PND (post-nasal drip) 06/29/2020   Chronic constipation 02/24/2020   Family history of colon cancer 02/24/2020   Bloating 02/24/2020   Osteoporosis 05/05/2019   Hemorrhoids 05/05/2019   Hyperlipidemia 05/05/2019   Anxiety 09/17/2018   Tinnitus 09/17/2018          Physical Exam   Triage Vital Signs: ED Triage Vitals  Encounter Vitals Group     BP 11/30/22 1213 (!) 142/76     Systolic BP Percentile --      Diastolic BP Percentile --      Pulse Rate 11/30/22 1213 68     Resp 11/30/22 1213 17     Temp 11/30/22 1213 98.5 F (36.9 C)     Temp Source 11/30/22 1213 Oral     SpO2 11/30/22 1213 97 %     Weight 11/30/22 1214 103 lb (46.7 kg)     Height 11/30/22 1214 5' (1.524 m)     Head Circumference --      Peak Flow --       Pain Score 11/30/22 1213 7     Pain Loc --      Pain Education --      Exclude from Growth Chart --     Most recent vital signs: Vitals:   11/30/22 1213 11/30/22 1541  BP: (!) 142/76 122/66  Pulse: 68 83  Resp: 17 16  Temp: 98.5 F (36.9 C) 99.4 F (37.4 C)  SpO2: 97% 99%    Physical Exam Vitals and nursing note reviewed.  Constitutional:      General: Awake and alert. No acute distress.    Appearance: Normal appearance. The patient is normal weight.  HENT:     Head: Normocephalic and atraumatic.     Mouth: Mucous membranes are moist.  Eyes:     General: PERRL. Normal EOMs        Right eye: No discharge.        Left eye: No discharge.     Conjunctiva/sclera: Conjunctivae normal.  Cardiovascular:     Rate and Rhythm: Normal rate and regular rhythm.     Pulses: Normal pulses.  Pulmonary:     Effort: Pulmonary effort is normal. No respiratory distress.     Breath sounds: Normal breath sounds.  Abdominal:  Abdomen is soft. There is right lower quadrant abdominal tenderness. No rebound or guarding. No distention. Musculoskeletal:        General: No swelling. Normal range of motion.     Cervical back: Normal range of motion and neck supple.  Skin:    General: Skin is warm and dry.     Capillary Refill: Capillary refill takes less than 2 seconds.     Findings: No rash.  Neurological:     Mental Status: The patient is awake and alert.      ED Results / Procedures / Treatments   Labs (all labs ordered are listed, but only abnormal results are displayed) Labs Reviewed  COMPREHENSIVE METABOLIC PANEL - Abnormal; Notable for the following components:      Result Value   Glucose, Bld 128 (*)    All other components within normal limits  CBC - Abnormal; Notable for the following components:   WBC 13.3 (*)    All other components within normal limits  URINALYSIS, ROUTINE W REFLEX MICROSCOPIC - Abnormal; Notable for the following components:   Color, Urine YELLOW (*)     APPearance CLEAR (*)    Ketones, ur 80 (*)    All other components within normal limits  LIPASE, BLOOD  SURGICAL PATHOLOGY     EKG     RADIOLOGY I independently reviewed and interpreted imaging and agree with radiologists findings.     PROCEDURES:  Critical Care performed:   Procedures   MEDICATIONS ORDERED IN ED: Medications  Chlorhexidine Gluconate Cloth 2 % PADS 6 each ( Topical Automatically Held 12/09/22 0600)  0.9 % irrigation (POUR BTL) (500 mLs Irrigation Given 11/30/22 1618)  bupivacaine-epinephrine (PF) (MARCAINE W/ EPI) 0.25% -1:200000 30 mL, bupivacaine liposome (EXPAREL) 1.3 % 20 mL (50 mLs Infiltration Given 11/30/22 1647)  sodium chloride irrigation 0.9 % (500 mLs Irrigation Given 11/30/22 1647)  iohexol (OMNIPAQUE) 300 MG/ML solution 75 mL (75 mLs Intravenous Contrast Given 11/30/22 1307)  piperacillin-tazobactam (ZOSYN) IVPB 3.375 g (3.375 g Intravenous Transfusing/Transfer 11/30/22 1527)  sodium chloride 0.9 % bolus 1,000 mL (1,000 mLs Intravenous Transfusing/Transfer 11/30/22 1526)     IMPRESSION / MDM / ASSESSMENT AND PLAN / ED COURSE  I reviewed the triage vital signs and the nursing notes.   Differential diagnosis includes, but is not limited to, appendicitis, urinary tract infection, pyelonephritis, diverticulitis, nephrolithiasis.  Patient is awake and alert, hemodynamically stable and afebrile.  She is nontoxic in appearance.  Labs obtained in triage reveal a leukocytosis to 13.3, normal LFTs and lipase, normal creatinine.  Urinalysis is not appear to be suspicious for urinary tract infection.  CT scan obtained for evaluation of appendicitis reveals dilated appendix with appendicoliths.  Clinical picture is consistent with appendicitis.  Dr. Everlene Farrier with general surgery was consulted, and he evaluated the patient and also agrees that this is consistent with acute appendicitis and recommends laparoscopic appendectomy.  Patient is in agreement  with this plan.  She started on Zosyn.  Patient admitted for OR.   Patient's presentation is most consistent with acute presentation with potential threat to life or bodily function.   Clinical Course as of 11/30/22 1651  Fri Nov 30, 2022  1441 Discussed with Dr. Everlene Farrier, he will come see the patient [JP]    Clinical Course User Index [JP] Aleida Crandell, Herb Grays, PA-C     FINAL CLINICAL IMPRESSION(S) / ED DIAGNOSES   Final diagnoses:  Acute appendicitis, unspecified acute appendicitis type     Rx / DC Orders  ED Discharge Orders     None        Note:  This document was prepared using Dragon voice recognition software and may include unintentional dictation errors.   Jackelyn Hoehn, PA-C 11/30/22 1652    Shaune Pollack, MD 12/07/22 1135

## 2022-12-03 ENCOUNTER — Encounter: Payer: Self-pay | Admitting: Surgery

## 2022-12-03 NOTE — Telephone Encounter (Signed)
Noted  

## 2022-12-04 LAB — SURGICAL PATHOLOGY

## 2022-12-10 NOTE — Anesthesia Postprocedure Evaluation (Signed)
Anesthesia Post Note  Patient: Beverly Lowe  Procedure(s) Performed: APPENDECTOMY LAPAROSCOPIC (Abdomen)  Patient location during evaluation: PACU Anesthesia Type: General Level of consciousness: awake and alert Pain management: pain level controlled Vital Signs Assessment: post-procedure vital signs reviewed and stable Respiratory status: spontaneous breathing, nonlabored ventilation, respiratory function stable and patient connected to nasal cannula oxygen Cardiovascular status: blood pressure returned to baseline and stable Postop Assessment: no apparent nausea or vomiting Anesthetic complications: no   No notable events documented.   Last Vitals:  Vitals:   11/30/22 1735 11/30/22 1740  BP: 98/66   Pulse: 79 81  Resp: 14 (!) 23  Temp: 36.6 C   SpO2: 98% 98%    Last Pain:  Vitals:   11/30/22 1735  TempSrc:   PainSc: 1                  Lenard Simmer

## 2022-12-19 ENCOUNTER — Encounter: Payer: Self-pay | Admitting: Physician Assistant

## 2022-12-19 ENCOUNTER — Ambulatory Visit: Payer: Medicare Other | Admitting: Physician Assistant

## 2022-12-19 VITALS — BP 122/69 | HR 83 | Temp 98.0°F | Ht 60.0 in | Wt 107.0 lb

## 2022-12-19 DIAGNOSIS — Z09 Encounter for follow-up examination after completed treatment for conditions other than malignant neoplasm: Secondary | ICD-10-CM

## 2022-12-19 DIAGNOSIS — K3580 Unspecified acute appendicitis: Secondary | ICD-10-CM

## 2022-12-19 DIAGNOSIS — K353 Acute appendicitis with localized peritonitis, without perforation or gangrene: Secondary | ICD-10-CM

## 2022-12-19 NOTE — Patient Instructions (Signed)

## 2022-12-19 NOTE — Progress Notes (Signed)
[facility] POST-OP OFFICE VISIT  12/19/2022  HPI: Beverly Lowe is a 77 y.o. female 19 days s/p laparoscopic appendectomy for acute appendicitis with Dr Everlene Farrier  She is doing very well Only had minimal pain for about 1-2 days Currently pain free No fever, chills, nausea, emesis, or bowel changes from baseline Incisions are well healed Ambulating without issue No other complaints   Vital signs: BP 122/69   Pulse 83   Temp 98 F (36.7 C)   Ht 5' (1.524 m)   Wt 107 lb (48.5 kg)   SpO2 99%   BMI 20.90 kg/m    Physical Exam: Constitutional: Well appearing female, NAD Abdomen: Soft, non-tender, non-distended, no rebound/guarding Skin: Laparoscopic incisions are healing well, no erythema or drainage   Assessment/Plan: This is a 77 y.o. female 19 days s/p laparoscopic appendectomy for acute appendicitis with Dr Everlene Farrier   - Pain control prn  - Reviewed wound care recommendation  - Reviewed lifting restrictions; 4 weeks total  - Reviewed surgical pathology; Appendicitis  - She can follow up on as needed basis; She understands to call with questions/concerns  -- Lynden Oxford, PA-C [facility] 12/19/2022, 1:46 PM M-F: 7am - 4pm

## 2022-12-26 ENCOUNTER — Ambulatory Visit (INDEPENDENT_AMBULATORY_CARE_PROVIDER_SITE_OTHER): Payer: Medicare Other | Admitting: Physician Assistant

## 2022-12-26 ENCOUNTER — Encounter: Payer: Self-pay | Admitting: Physician Assistant

## 2022-12-26 VITALS — BP 156/80 | HR 76 | Temp 98.4°F | Ht 60.0 in | Wt 105.6 lb

## 2022-12-26 DIAGNOSIS — Z09 Encounter for follow-up examination after completed treatment for conditions other than malignant neoplasm: Secondary | ICD-10-CM

## 2022-12-26 DIAGNOSIS — K3580 Unspecified acute appendicitis: Secondary | ICD-10-CM

## 2022-12-26 DIAGNOSIS — K353 Acute appendicitis with localized peritonitis, without perforation or gangrene: Secondary | ICD-10-CM

## 2022-12-26 NOTE — Patient Instructions (Signed)

## 2022-12-26 NOTE — Progress Notes (Signed)
[facility] POST-OP OFFICE VISIT  12/26/2022  HPI: Beverly Lowe is a 77 y.o. female 26 days s/p laparoscopic appendectomy for acute appendicitis with Dr Everlene Farrier   She continued to do well Noticed 2-3 days ago a small stitch poking through the inferior corner of her umbilical incision  No fever, chill No other complaints   Vital signs: BP (!) 156/80 (BP Location: Left Arm, Patient Position: Sitting, Cuff Size: Small)   Pulse 76   Temp 98.4 F (36.9 C) (Oral)   Ht 5' (1.524 m)   Wt 105 lb 9.6 oz (47.9 kg)   SpO2 97%   BMI 20.62 kg/m    Physical Exam: Constitutional: Well appearing female, NAD Abdomen: Soft, non-tender, non-distended, no rebound/guarding Skin: Laparoscopic incisions are healing well, no erythema or drainage. To the inferior corner of the umbilical incision there was a small piece of the Monocryl tail hanging at the skin level. This was cut flush to the skin without issue.   Assessment/Plan: This is a 77 y.o. female 26 days s/p laparoscopic appendectomy for acute appendicitis with Dr Everlene Farrier    - Tail of the Monocryl suture cute flush with the skin without issue  - Otherwise wounds are well healed  - Reviewed lifting restrictions; she will complete these by the end of the week  - She can follow up on as needed basis; She understands to call with questions/concerns  -- Lynden Oxford, PA-C [facility] 12/26/2022, 3:30 PM M-F: 7am - 4pm

## 2023-02-04 ENCOUNTER — Other Ambulatory Visit: Payer: Self-pay | Admitting: Family

## 2023-02-04 DIAGNOSIS — F419 Anxiety disorder, unspecified: Secondary | ICD-10-CM

## 2023-02-21 ENCOUNTER — Ambulatory Visit
Admission: RE | Admit: 2023-02-21 | Discharge: 2023-02-21 | Disposition: A | Discharge status: Home / Self Care | Payer: Medicare Other | Source: Ambulatory Visit | Attending: Family Medicine | Admitting: Family Medicine

## 2023-02-21 DIAGNOSIS — Z1231 Encounter for screening mammogram for malignant neoplasm of breast: Secondary | ICD-10-CM | POA: Insufficient documentation

## 2023-03-27 ENCOUNTER — Other Ambulatory Visit: Payer: Self-pay | Admitting: Family Medicine

## 2023-03-27 DIAGNOSIS — R0982 Postnasal drip: Secondary | ICD-10-CM

## 2023-05-21 ENCOUNTER — Encounter: Payer: Self-pay | Admitting: Family Medicine

## 2023-06-03 NOTE — Telephone Encounter (Signed)
 Noted.

## 2023-10-28 ENCOUNTER — Ambulatory Visit (INDEPENDENT_AMBULATORY_CARE_PROVIDER_SITE_OTHER): Payer: Medicare Other | Admitting: *Deleted

## 2023-10-28 VITALS — Ht 60.0 in | Wt 105.0 lb

## 2023-10-28 DIAGNOSIS — Z0000 Encounter for general adult medical examination without abnormal findings: Secondary | ICD-10-CM

## 2023-10-28 NOTE — Patient Instructions (Signed)
 Ms. Jennings,  Thank you for taking the time for your Medicare Wellness Visit. I appreciate your continued commitment to your health goals. Please review the care plan we discussed, and feel free to reach out if I can assist you further.  Medicare recommends these wellness visits once per year to help you and your care team stay ahead of potential health issues. These visits are designed to focus on prevention, allowing your provider to concentrate on managing your acute and chronic conditions during your regular appointments.  Please note that Annual Wellness Visits do not include a physical exam. Some assessments may be limited, especially if the visit was conducted virtually. If needed, we may recommend a separate in-person follow-up with your provider.  Ongoing Care Seeing your primary care provider every 3 to 6 months helps us  monitor your health and provide consistent, personalized care.  Consider updating your flu, covid tetanus and pneumonia vaccines.   Referrals If a referral was made during today's visit and you haven't received any updates within two weeks, please contact the referred provider directly to check on the status.  Recommended Screenings:  Health Maintenance  Topic Date Due   DTaP/Tdap/Td vaccine (1 - Tdap) Never done   Pneumococcal Vaccine for age over 78 (1 of 2 - PCV) Never done   Zoster (Shingles) Vaccine (2 of 2) 06/08/2021   Flu Shot  09/06/2023   COVID-19 Vaccine (5 - 2025-26 season) 10/07/2023   Medicare Annual Wellness Visit  10/27/2024   DEXA scan (bone density measurement)  Completed   Hepatitis C Screening  Completed   HPV Vaccine  Aged Out   Meningitis B Vaccine  Aged Out   Breast Cancer Screening  Discontinued   Colon Cancer Screening  Discontinued       10/28/2023    1:59 PM  Advanced Directives  Does Patient Have a Medical Advance Directive? No  Would patient like information on creating a medical advance directive? No - Patient declined    Advance Care Planning is important because it: Ensures you receive medical care that aligns with your values, goals, and preferences. Provides guidance to your family and loved ones, reducing the emotional burden of decision-making during critical moments.  Vision: Annual vision screenings are recommended for early detection of glaucoma, cataracts, and diabetic retinopathy. These exams can also reveal signs of chronic conditions such as diabetes and high blood pressure.  Dental: Annual dental screenings help detect early signs of oral cancer, gum disease, and other conditions linked to overall health, including heart disease and diabetes.  Please see the attached documents for additional preventive care recommendations.

## 2023-10-28 NOTE — Progress Notes (Signed)
 Subjective:   Beverly Lowe is a 78 y.o. who presents for a Medicare Wellness preventive visit.  As a reminder, Annual Wellness Visits don't include a physical exam, and some assessments may be limited, especially if this visit is performed virtually. We may recommend an in-person follow-up visit with your provider if needed.  Visit Complete: Virtual I connected with  Jamica Luiz on 10/28/23 by a audio enabled telemedicine application and verified that I am speaking with the correct person using two identifiers.  Patient Location: Home  Provider Location: Home Office  I discussed the limitations of evaluation and management by telemedicine. The patient expressed understanding and agreed to proceed.  Vital Signs: Because this visit was a virtual/telehealth visit, some criteria may be missing or patient reported. Any vitals not documented were not able to be obtained and vitals that have been documented are patient reported.  VideoDeclined- This patient declined Librarian, academic. Therefore the visit was completed with audio only.  Persons Participating in Visit: Patient.  AWV Questionnaire: Yes: Patient Medicare AWV questionnaire was completed by the patient on 10/24/23; I have confirmed that all information answered by patient is correct and no changes since this date.  Cardiac Risk Factors include: advanced age (>37men, >52 women);dyslipidemia     Objective:    Today's Vitals   10/28/23 1345  Weight: 105 lb (47.6 kg)  Height: 5' (1.524 m)   Body mass index is 20.51 kg/m.     10/28/2023    1:59 PM 11/30/2022   12:15 PM 10/23/2022    1:44 PM 10/21/2020    1:18 PM 10/21/2019    1:49 PM  Advanced Directives  Does Patient Have a Medical Advance Directive? No No No No No  Does patient want to make changes to medical advance directive?    No - Patient declined   Would patient like information on creating a medical advance directive? No -  Patient declined  No - Patient declined  No - Patient declined    Current Medications (verified) Outpatient Encounter Medications as of 10/28/2023  Medication Sig   Ascorbic Acid (VITAMIN C PO) Take 1 tablet by mouth daily.   CALCIUM PO Take 1 tablet by mouth daily.   Cholecalciferol (VITAMIN D ) 125 MCG (5000 UT) CAPS Take by mouth daily.   Dapsone 7.5 % GEL Apply topically daily.   hydrocortisone  (ANUSOL -HC) 2.5 % rectal cream Place rectally 2 (two) times daily as needed for hemorrhoids or anal itching.   ipratropium (ATROVENT ) 0.03 % nasal spray USE 2 SPRAYS IN EACH NOSTRIL THREE TIMES DAILY AS NEEDED FOR RHINITIS   MAGNESIUM PO Take 1 tablet by mouth daily.   polyethylene glycol (MIRALAX / GLYCOLAX) 17 g packet Take 17 g by mouth as needed for mild constipation.   tretinoin (RETIN-A) 0.05 % cream Apply 1 Application topically at bedtime.   valACYclovir  (VALTREX ) 1000 MG tablet Take 2 tablets (2,000 mg total) by mouth 2 (two) times daily as needed. X 1 day outbreak prn   VITAMIN D  PO Take 1 tablet by mouth daily.   Vitamin D -Vitamin K (VITAMIN K2-VITAMIN D3 PO) Take by mouth daily.   zinc gluconate 50 MG tablet Take 50 mg by mouth daily.   ALPRAZolam  (XANAX ) 0.25 MG tablet TAKE 1/2 TABLET(0.125 MG) BY MOUTH DAILY AS NEEDED FOR ANXIETY (Patient not taking: Reported on 10/28/2023)   No facility-administered encounter medications on file as of 10/28/2023.    Allergies (verified) Lactose intolerance (gi), Soy allergy (obsolete), and  Gluten meal   History: Past Medical History:  Diagnosis Date   Anemia    Anxiety    did not like the way ssri made her feel ? name   Bowel habit changes 07/11/2021   Cataract    Colon polyps    COVID-19 01/18/2021   Hemorrhoids    Hemorrhoids    Herpes    oral   Insomnia 09/17/2018   Osteoporosis    Shingles    right back/flank remotely lasting 9 months of pain was on gabapentin   Tinnitus    Past Surgical History:  Procedure Laterality Date    COLONOSCOPY     dental implants     LAPAROSCOPIC APPENDECTOMY N/A 11/30/2022   Procedure: APPENDECTOMY LAPAROSCOPIC;  Surgeon: Jordis Laneta FALCON, MD;  Location: [facility] ORS;  Service: General;  Laterality: N/A;   NASAL SEPTUM SURGERY     15 years ago   Family History  Problem Relation Age of Onset   Cancer Mother        breast later in 35s and uterine   Osteoporosis Mother    Colon cancer Father        Dx between age 27-60   Diabetes Father    Heart disease Father    Atrial fibrillation Father    Cancer Brother        colon dx in 30s   Colon cancer Brother        Dx in his 97-50   Breast cancer Maternal Aunt    Osteoporosis Maternal Grandmother    Atrial fibrillation Nephew    Colon polyps Neg Hx    Esophageal cancer Neg Hx    Stomach cancer Neg Hx    Rectal cancer Neg Hx    Social History   Socioeconomic History   Marital status: Married    Spouse name: Not on file   Number of children: 2   Years of education: Not on file   Highest education level: Bachelor's degree (e.g., BA, AB, BS)  Occupational History   Not on file  Tobacco Use   Smoking status: Never    Passive exposure: Never   Smokeless tobacco: Never  Vaping Use   Vaping status: Never Used  Substance and Sexual Activity   Alcohol use: Yes    Comment: socially   Drug use: Never   Sexual activity: Yes  Other Topics Concern   Not on file  Social History Narrative   Moved from [place] in 3 or 05/2018 to be near son and grandkids    Never smoker    Exercise yes      DPR Husband Carrine Kroboth [phone]   Social Drivers of Health   Financial Resource Strain: Low Risk  (10/24/2023)   Overall Financial Resource Strain (CARDIA)    Difficulty of Paying Living Expenses: Not hard at all  Food Insecurity: No Food Insecurity (10/24/2023)   Hunger Vital Sign    Worried About Running Out of Food in the Last Year: Never true    Ran Out of Food in the Last Year: Never true  Transportation Needs: No  Transportation Needs (10/24/2023)   PRAPARE - Administrator, Civil Service (Medical): No    Lack of Transportation (Non-Medical): No  Physical Activity: Sufficiently Active (10/24/2023)   Exercise Vital Sign    Days of Exercise per Week: 3 days    Minutes of Exercise per Session: 50 min  Stress: No Stress Concern Present (10/24/2023)   [employer]  of Occupational Health - Occupational Stress Questionnaire    Feeling of Stress: Not at all  Social Connections: Socially Integrated (10/24/2023)   Social Connection and Isolation Panel    Frequency of Communication with Friends and Family: More than three times a week    Frequency of Social Gatherings with Friends and Family: More than three times a week    Attends Religious Services: More than 4 times per year    Active Member of [employer] or Organizations: Yes    Attends Engineer, structural: More than 4 times per year    Marital Status: Married    Tobacco Counseling Counseling given: Not Answered    Clinical Intake:  Pre-visit preparation completed: Yes  Pain : No/denies pain     BMI - recorded: 20.51 Nutritional Status: BMI of 19-24  Normal Nutritional Risks: None Diabetes: No  No results found for: HGBA1C   How often do you need to have someone help you when you read instructions, pamphlets, or other written materials from your doctor or pharmacy?: 1 - Never  Interpreter Needed?: No  Information entered by :: R. Keilani Terrance LPN   Activities of Daily Living     10/24/2023    4:18 PM  In your present state of health, do you have any difficulty performing the following activities:  Hearing? 0  Vision? 0  Difficulty concentrating or making decisions? 0  Walking or climbing stairs? 0  Dressing or bathing? 0  Doing errands, shopping? 0  Preparing Food and eating ? N  Using the Toilet? N  In the past six months, have you accidently leaked urine? N  Do you have problems with loss of bowel control? N   Managing your Medications? N  Managing your Finances? N  Housekeeping or managing your Housekeeping? N    Patient Care Team: Darliss Rogue, MD as PCP - Cardiology (Cardiology) Shila Gustav GAILS, MD as Consulting Physician (Gastroenterology) Darliss Rogue, MD as Consulting Physician (Cardiology)  I have updated your Care Teams any recent Medical Services you may have received from other providers in the past year.     Assessment:   This is a routine wellness examination for [facility].  Hearing/Vision screen Hearing Screening - Comments:: No issues Vision Screening - Comments:: No correction   Goals Addressed             This Visit's Progress    Patient Stated       Wants to stay active, work out and not feel old       Depression Screen     10/28/2023    1:51 PM 10/23/2022    1:40 PM 09/20/2022    9:31 AM 03/29/2022    8:51 AM 07/11/2021   11:07 AM 01/18/2021   12:09 PM 10/21/2020    1:25 PM  PHQ 2/9 Scores  PHQ - 2 Score 0 0 0 0 0 0 0  PHQ- 9 Score 0 0 0        Fall Risk     10/24/2023    4:18 PM 12/19/2022    1:34 PM 10/19/2022    8:58 AM 09/20/2022    9:31 AM 03/29/2022    8:51 AM  Fall Risk   Falls in the past year? 0 0 0 0 0  Number falls in past yr: 0 0 0 0 0  Injury with Fall? 0 0 0 0 0  Risk for fall due to : No Fall Risks  No Fall Risks No Fall Risks  No Fall Risks  Follow up Falls evaluation completed;Falls prevention discussed  Falls prevention discussed;Falls evaluation completed Falls evaluation completed Falls evaluation completed    MEDICARE RISK AT HOME:  Medicare Risk at Home Any stairs in or around the home?: Yes If so, are there any without handrails?: No Home free of loose throw rugs in walkways, pet beds, electrical cords, etc?: Yes Adequate lighting in your home to reduce risk of falls?: Yes Life alert?: No Use of a cane, walker or w/c?: No Grab bars in the bathroom?: No Shower chair or bench in shower?: Yes Elevated toilet  seat or a handicapped toilet?: No  TIMED UP AND GO:  Was the test performed?  No  Cognitive Function: 6CIT completed        10/28/2023    1:59 PM 10/23/2022    1:45 PM  6CIT Screen  What Year? 0 points 0 points  What month? 0 points 0 points  What time? 0 points 0 points  Count back from 20 0 points 0 points  Months in reverse 0 points 0 points  Repeat phrase 0 points 2 points  Total Score 0 points 2 points    Immunizations Immunization History  Administered Date(s) Administered   Fluad Quad(high Dose 65+) 10/03/2018, 11/05/2019, 01/03/2021   PFIZER(Purple Top)SARS-COV-2 Vaccination 02/12/2019, 03/05/2019, 10/05/2019, 10/04/2020   Zoster Recombinant(Shingrix ) 04/13/2021    Screening Tests Health Maintenance  Topic Date Due   DTaP/Tdap/Td (1 - Tdap) Never done   Pneumococcal Vaccine: 50+ Years (1 of 2 - PCV) Never done   Zoster Vaccines- Shingrix  (2 of 2) 06/08/2021   Influenza Vaccine  09/06/2023   COVID-19 Vaccine (5 - 2025-26 season) 10/07/2023   Medicare Annual Wellness (AWV)  10/23/2023   DEXA SCAN  Completed   Hepatitis C Screening  Completed   HPV VACCINES  Aged Out   Meningococcal B Vaccine  Aged Out   Mammogram  Discontinued   Colonoscopy  Discontinued    Health Maintenance Items Addressed: Discussed the need to update flu, covid, tetanus and pneumonia vaccines which patient stated that she is on the fence about some of the vaccines.  Patient declines the second shingles vaccine because the first one made her feel bad.  Additional Screening:  Vision Screening: Recommended annual ophthalmology exams for early detection of glaucoma and other disorders of the eye. Is the patient up to date with their annual eye exam?  Yes  Who is the provider or what is the name of the office in which the patient attends annual eye exams?  [facility]  Dental Screening: Recommended annual dental exams for proper oral hygiene  Community Resource Referral / Chronic Care  Management: CRR required this visit?  No   CCM required this visit?  No   Plan:    I have personally reviewed and noted the following in the patient's chart:   Medical and social history Use of alcohol, tobacco or illicit drugs  Current medications and supplements including opioid prescriptions. Patient is not currently taking opioid prescriptions. Functional ability and status Nutritional status Physical activity Advanced directives List of other physicians Hospitalizations, surgeries, and ER visits in previous 12 months Vitals Screenings to include cognitive, depression, and falls Referrals and appointments  In addition, I have reviewed and discussed with patient certain preventive protocols, quality metrics, and best practice recommendations. A written personalized care plan for preventive services as well as general preventive health recommendations were provided to patient.   Angeline Fredericks, [place]   [id number]  After Visit Summary: (MyChart) Due to this being a telephonic visit, the after visit summary with patients personalized plan was offered to patient via MyChart   Notes: Nothing significant to report at this time.

## 2023-11-12 ENCOUNTER — Ambulatory Visit

## 2023-11-12 VITALS — BP 118/82 | HR 79 | Temp 98.6°F | Ht 60.0 in | Wt 109.8 lb

## 2023-11-12 DIAGNOSIS — Z131 Encounter for screening for diabetes mellitus: Secondary | ICD-10-CM | POA: Diagnosis not present

## 2023-11-12 DIAGNOSIS — H9319 Tinnitus, unspecified ear: Secondary | ICD-10-CM

## 2023-11-12 DIAGNOSIS — K649 Unspecified hemorrhoids: Secondary | ICD-10-CM | POA: Diagnosis not present

## 2023-11-12 DIAGNOSIS — E782 Mixed hyperlipidemia: Secondary | ICD-10-CM | POA: Diagnosis not present

## 2023-11-12 DIAGNOSIS — I351 Nonrheumatic aortic (valve) insufficiency: Secondary | ICD-10-CM

## 2023-11-12 DIAGNOSIS — I77819 Aortic ectasia, unspecified site: Secondary | ICD-10-CM

## 2023-11-12 DIAGNOSIS — R0982 Postnasal drip: Secondary | ICD-10-CM | POA: Diagnosis not present

## 2023-11-12 DIAGNOSIS — E785 Hyperlipidemia, unspecified: Secondary | ICD-10-CM

## 2023-11-12 DIAGNOSIS — B009 Herpesviral infection, unspecified: Secondary | ICD-10-CM

## 2023-11-12 DIAGNOSIS — I781 Nevus, non-neoplastic: Secondary | ICD-10-CM

## 2023-11-12 DIAGNOSIS — F419 Anxiety disorder, unspecified: Secondary | ICD-10-CM

## 2023-11-12 DIAGNOSIS — Z1329 Encounter for screening for other suspected endocrine disorder: Secondary | ICD-10-CM

## 2023-11-12 MED ORDER — HYDROCORTISONE (PERIANAL) 2.5 % EX CREA: TOPICAL_CREAM | Freq: Two times a day (BID) | CUTANEOUS | 11 refills | Status: AC | PRN

## 2023-11-12 MED ORDER — FLUTICASONE PROPIONATE 50 MCG/ACT NA SUSP: 2.0000 | Freq: Every day | NASAL | 6 refills | Status: AC

## 2023-11-12 MED ORDER — HYDROXYZINE PAMOATE 25 MG PO CAPS: 25.0000 mg | ORAL_CAPSULE | Freq: Every evening | ORAL | 0 refills | Status: DC | PRN

## 2023-11-12 NOTE — Assessment & Plan Note (Signed)
 Chronic. Asymptomatic. She was recommended to f/u in 2 years with cardiology with repeat echo during her evaluation in 2023. Patient provided with phone number to call to schedule an appointment with cardiology for a follow up.

## 2023-11-12 NOTE — Assessment & Plan Note (Signed)
 Chronic. Elevated LDL cholesterol in the past. Discussed lifestyle modifications and potential medication. Schedule fasting lab for cholesterol.  Recommend continuing regular moderate intensity exercise, cutting down on carbohydrate rich food.

## 2023-11-12 NOTE — Assessment & Plan Note (Signed)
 Chronic.  Stable. Continue Anusol  2.5% twice daily as needed, refill sent.

## 2023-11-12 NOTE — Assessment & Plan Note (Signed)
 Chronic. Affecting left ear. Previously evaluated by ENT and was told hearing aids may be beneficial.  Patient not ready for hearing aids yet. Was previously prescribed Xanax  0.125 mg at night as needed to help with sleep secondary to tinnitus. Discussed risks related to use of benzodiazepine, discontinue xanax  for now.  Discussed hydroxyzine as a safer alternative to Xanax . Educate on potential side effects of hydroxyzine, including morning grogginess.Advise trial of hydroxyzine 25 mg prn at night time on a day without commitments. Consider trial of Melatonin if patient not able to tolerate this. F/U in 3 months.

## 2023-11-12 NOTE — Progress Notes (Signed)
 Established Patient Office Visit TOC from Dr. Hope    Subjective  Patient ID: Beverly Lowe, female    DOB: [birth year]  Age: 78 y.o. MRN: [id number]  Chief Complaint  Patient presents with   Establish Care   Medication Management    She  has a past medical history of Allergy (Gluten), Anemia, Anxiety, Bowel habit changes (07/11/2021), Cataract, Chronic use of benzodiazepine for therapeutic purpose (07/22/2022), Colon polyps, COVID-19 (01/18/2021), GERD (gastroesophageal reflux disease) (2019), Hemorrhoids, Hemorrhoids, Herpes, Insomnia (09/17/2018), Osteoporosis, Shingles, Tinnitus, and Vitamin D  deficiency (07/22/2022).  HPI  Discussed the use of AI scribe software for clinical note transcription with the patient, who gave verbal consent to proceed.  History of Present Illness Beverly Lowe is a 78 year old female who presents for transfer of care from previous PCP and chronic follow up.    She has a history of aortic root dilation, last cardiology evaluation was in 2023 and was recommended 2 year follow up with repeat echo. She reports that she has not had any recent episodes of passing out, chest pain.   She has irritable bowel syndrome (IBS). She uses Miralax approximately every other week and does not experience significant diarrhea with magnesium use. She has hemorrhoids and uses Anusol -HC prn.   She experiences tinnitus in her left ear. Previously treated with prn Xanax  to help with sleep related to tinnitus. She has seen ENT and was recommended trial of hearing aids in the past. She is not interested in hearing aids yet. Xanax  was on 05/22/2022, 60 tab of Alprazolam  0.25 mg was done. She uses Xanax  sparingly, cutting 15 mg tablets in half and using them only during particularly bad nights. She has tried Tylenol  PM but found it made her groggy. She has not tried melatonin.   There have been no recent flare-ups of fever blisters, and she has not used Valtrex  in the  past couple of years.  She uses Flonase regularly for PND and finds it effective without causing dryness.   She takes several supplements including vitamin C, calcium, vitamin D , and magnesium. She takes magnesium citrate in the morning with her other vitamins.  In terms of social history, she drinks alcohol socially but reports zero consumption in a typical week unless she is out. She is cautious about her diet, trying to limit carbohydrates and processed foods, and drinks only water and coffee.  She has a h/o elevated LDL and is not too keen on starting cholesterol lowering medication.     ROS As per HPI    Objective:     BP 118/82 (BP Location: Right Arm, Patient Position: Sitting, Cuff Size: Small)   Pulse 79   Temp 98.6 F (37 C) (Oral)   Ht 5' (1.524 m)   Wt 109 lb 12.8 Lowe (49.8 kg)   SpO2 96%   BMI 21.44 kg/m      11/12/2023    2:12 PM 10/28/2023    1:51 PM 10/23/2022    1:40 PM  Depression screen PHQ 2/9  Decreased Interest 0 0 0  Down, Depressed, Hopeless 0 0 0  PHQ - 2 Score 0 0 0  Altered sleeping 0 0 0  Tired, decreased energy 0 0 0  Change in appetite 0 0 0  Feeling bad or failure about yourself  0 0 0  Trouble concentrating 0 0 0  Moving slowly or fidgety/restless 0 0 0  Suicidal thoughts 0 0 0  PHQ-9 Score 0 0 0  Difficult  doing work/chores Not difficult at all Not difficult at all       11/12/2023    2:12 PM 09/20/2022    9:31 AM 05/05/2019    2:49 PM 09/17/2018    2:34 PM  GAD 7 : Generalized Anxiety Score  Nervous, Anxious, on Edge 0 0 0 1  Control/stop worrying 0 0 0 1  Worry too much - different things 0 0 0 2  Trouble relaxing 0 0 0 1  Restless 0 0 0 2  Easily annoyed or irritable 0 0 0 2  Afraid - awful might happen 0 0 0 1  Total GAD 7 Score 0 0 0 10  Anxiety Difficulty Not difficult at all Not difficult at all Not difficult at all       11/12/2023    2:12 PM 10/28/2023    1:51 PM 10/23/2022    1:40 PM  Depression screen PHQ 2/9   Decreased Interest 0 0 0  Down, Depressed, Hopeless 0 0 0  PHQ - 2 Score 0 0 0  Altered sleeping 0 0 0  Tired, decreased energy 0 0 0  Change in appetite 0 0 0  Feeling bad or failure about yourself  0 0 0  Trouble concentrating 0 0 0  Moving slowly or fidgety/restless 0 0 0  Suicidal thoughts 0 0 0  PHQ-9 Score 0 0 0  Difficult doing work/chores Not difficult at all Not difficult at all       11/12/2023    2:12 PM 09/20/2022    9:31 AM 05/05/2019    2:49 PM 09/17/2018    2:34 PM  GAD 7 : Generalized Anxiety Score  Nervous, Anxious, on Edge 0 0 0 1  Control/stop worrying 0 0 0 1  Worry too much - different things 0 0 0 2  Trouble relaxing 0 0 0 1  Restless 0 0 0 2  Easily annoyed or irritable 0 0 0 2  Afraid - awful might happen 0 0 0 1  Total GAD 7 Score 0 0 0 10  Anxiety Difficulty Not difficult at all Not difficult at all Not difficult at all    SDOH Screenings   Food Insecurity: No Food Insecurity (10/24/2023)  Housing: Unknown (10/24/2023)  Transportation Needs: No Transportation Needs (10/24/2023)  Utilities: Not At Risk (10/28/2023)  Alcohol Screen: Low Risk  (10/24/2023)  Depression (PHQ2-9): Low Risk  (11/12/2023)  Financial Resource Strain: Low Risk  (10/24/2023)  Physical Activity: Sufficiently Active (10/24/2023)  Social Connections: Socially Integrated (10/24/2023)  Stress: No Stress Concern Present (10/24/2023)  Tobacco Use: Low Risk  (11/12/2023)  Health Literacy: Adequate Health Literacy (10/28/2023)     Physical Exam HENT:     Head: Normocephalic and atraumatic.     Right Ear: There is no impacted cerumen.     Left Ear: There is no impacted cerumen.     Ears:     Comments: Narrow external ear canal b/l  Cardiovascular:     Rate and Rhythm: Normal rate.  Pulmonary:     Effort: Pulmonary effort is normal.     Breath sounds: Normal breath sounds.  Abdominal:     General: Bowel sounds are normal.     Palpations: Abdomen is soft.     Tenderness: There is no  guarding.  Musculoskeletal:     Cervical back: Neck supple.     Right lower leg: No edema.     Left lower leg: No edema.  Lymphadenopathy:  Cervical: No cervical adenopathy.  Neurological:     Mental Status: She is oriented to person, place, and time.     Gait: Gait normal.  Psychiatric:        Mood and Affect: Mood normal.        No results found for any visits on 11/12/23.  The 10-year ASCVD risk score (Arnett DK, et al., 2019) is: 21.6%     Assessment & Plan:   Mixed hyperlipidemia Assessment & Plan: Chronic. Elevated LDL cholesterol in the past. Discussed lifestyle modifications and potential medication. Schedule fasting lab for cholesterol.  Recommend continuing regular moderate intensity exercise, cutting down on carbohydrate rich food.   Orders: -     Lipid panel; Future  Hemorrhoids, unspecified hemorrhoid type Assessment & Plan: Chronic.  Stable. Continue Anusol  2.5% twice daily as needed, refill sent.    Orders: -     Hydrocortisone  (Perianal); Place rectally 2 (two) times daily as needed for hemorrhoids or anal itching.  Dispense: 30 g; Refill: 11  PND (post-nasal drip) Assessment & Plan: Symptoms well controlled on nasal Flonase. Continue Flonase, 2 puffs in each nostril. Refill sent.   Orders: -     Fluticasone Propionate; Place 2 sprays into both nostrils daily.  Dispense: 16 g; Refill: 6  Encounter for screening examination for intermediate hyperglycemia and diabetes mellitus Assessment & Plan: Check A1c, 11/20/22 showed elevated blood glucose on CMP.   Orders: -     Hemoglobin A1c; Future  Tinnitus, unspecified laterality Assessment & Plan: Chronic. Affecting left ear. Previously evaluated by ENT and was told hearing aids may be beneficial.  Patient not ready for hearing aids yet. Was previously prescribed Xanax  0.125 mg at night as needed to help with sleep secondary to tinnitus. Discussed risks related to use of benzodiazepine,  discontinue xanax  for now.  Discussed hydroxyzine as a safer alternative to Xanax . Educate on potential side effects of hydroxyzine, including morning grogginess.Advise trial of hydroxyzine 25 mg prn at night time on a day without commitments. Consider trial of Melatonin if patient not able to tolerate this. F/U in 3 months.   Orders: -     hydrOXYzine Pamoate; Take 1 capsule (25 mg total) by mouth at bedtime as needed (TInnitus).  Dispense: 30 capsule; Refill: 0  Aortic valve insufficiency, etiology of cardiac valve disease unspecified Assessment & Plan: Chronic. Asymptomatic. She was recommended to f/u in 2 years with cardiology with repeat echo during her evaluation in 2023. Patient provided with phone number to call to schedule an appointment with cardiology for a follow up.    Aortic dilatation Assessment & Plan: Chronic. Asymptomatic. She was recommended to f/u in 2 years with cardiology with repeat echo during her evaluation in 2023. Patient provided with phone number to call to schedule an appointment with cardiology for a follow up.      Return in about 3 months (around 02/12/2024) for f/u with Dr. Abbey in 3 months, labs only appointment/fasting whenever convinience for the patient .   Luke Abbey, MD

## 2023-11-12 NOTE — Assessment & Plan Note (Signed)
 Check A1c, 11/20/22 showed elevated blood glucose on CMP.

## 2023-11-12 NOTE — Assessment & Plan Note (Signed)
 Symptoms well controlled on nasal Flonase. Continue Flonase, 2 puffs in each nostril. Refill sent.

## 2023-11-12 NOTE — Patient Instructions (Addendum)
-   You are due for tetanus booster, shingles booster, COVID-19, pneumonia, influenza immunization. You can get this updated through your local pharmacy.   - Try Hydroxyzine 25 mg as needed at bedtime to help with ringing in ears and sleep. It can sometimes cause morning grogginess.   - Diet: Emphasize whole grains, lean proteins, fruits, and vegetables. Limit processed foods, carbohydrates and sugary drinks. Exercise: Aim for 150 minutes of moderate aerobic activity weekly plus strength training twice a week.  - Call [facility] cardiology to schedule an appointment with a cardiologist and repeat echocardiogram:   Phone number: [phone]

## 2023-11-15 ENCOUNTER — Ambulatory Visit: Payer: Self-pay

## 2023-11-15 ENCOUNTER — Other Ambulatory Visit (INDEPENDENT_AMBULATORY_CARE_PROVIDER_SITE_OTHER)

## 2023-11-15 DIAGNOSIS — E782 Mixed hyperlipidemia: Secondary | ICD-10-CM

## 2023-11-15 DIAGNOSIS — Z131 Encounter for screening for diabetes mellitus: Secondary | ICD-10-CM | POA: Diagnosis not present

## 2023-11-15 DIAGNOSIS — E559 Vitamin D deficiency, unspecified: Secondary | ICD-10-CM

## 2023-11-15 LAB — LIPID PANEL
Cholesterol: 211 mg/dL — ABNORMAL HIGH (ref 0–200)
HDL: 74.2 mg/dL (ref >39.00)
LDL Cholesterol: 121 mg/dL — ABNORMAL HIGH (ref 0–99)
NonHDL: 136.71
Total CHOL/HDL Ratio: 3
Triglycerides: 80 mg/dL (ref 0.0–149.0)
VLDL: 16 mg/dL (ref 0.0–40.0)

## 2023-11-15 LAB — HEMOGLOBIN A1C: Hgb A1c MFr Bld: 5.5 % (ref 4.6–6.5)

## 2023-11-15 NOTE — Progress Notes (Signed)
 Please let the patient know her A1c looks stable.  Cholesterol shows elevation in LDL or bad cholesterol at 878 compared to 115 a year ago. I recommend starting small dose of statin called Rosuvastatin 5 mg at bedtime to help reduce cholesterol related adverse health outcome like heart attack, stroke. If patient is agreeable recommend please sign the pended prescription, if not please cancel it. If we are to start Rosuvastatin recommend office visit in 2-3 months to repeat labs and follow up with me.   Thank you,  Luke Shade, MD

## 2023-11-15 NOTE — Telephone Encounter (Signed)
 Patient was notified and made aware of recent lab results and recommendations. Patient states she would like to alter her diet first since the numbers aren't that high. Patient states she does eat a lot of fatty foods, but will cut back.

## 2023-11-17 NOTE — Telephone Encounter (Signed)
 Noted. Recommend repeat fasting cholesterol 2 days before her next appointment with me on 03/03/24. Future lab ordered.   1. Mixed hyperlipidemia (Primary) - Comp Met (CMET); Future - Lipid panel; Future  2. Vitamin D  deficiency - Vitamin D  (25 hydroxy); Future    Thank you,  Luke Shade, MD

## 2023-12-24 NOTE — Telephone Encounter (Signed)
 open in error

## 2024-01-09 ENCOUNTER — Ambulatory Visit: Attending: Cardiology | Admitting: Cardiology

## 2024-01-09 ENCOUNTER — Encounter: Payer: Self-pay | Admitting: Cardiology

## 2024-01-09 VITALS — BP 120/66 | HR 59 | Ht 60.0 in | Wt 111.0 lb

## 2024-01-09 DIAGNOSIS — I77810 Thoracic aortic ectasia: Secondary | ICD-10-CM | POA: Insufficient documentation

## 2024-01-09 DIAGNOSIS — I340 Nonrheumatic mitral (valve) insufficiency: Secondary | ICD-10-CM | POA: Insufficient documentation

## 2024-01-09 NOTE — Patient Instructions (Signed)
 Medication Instructions:  Your physician recommends that you continue on your current medications as directed. Please refer to the Current Medication list given to you today.   *If you need a refill on your cardiac medications before your next appointment, please call your pharmacy*  Lab Work: No labs ordered today  If you have labs (blood work) drawn today and your tests are completely normal, you will receive your results only by: MyChart Message (if you have MyChart) OR A paper copy in the mail If you have any lab test that is abnormal or we need to change your treatment, we will call you to review the results.  Testing/Procedures: Your physician has requested that you have an echocardiogram. Echocardiography is a painless test that uses sound waves to create images of your heart. It provides your doctor with information about the size and shape of your heart and how well your heart's chambers and valves are working.   You may receive an ultrasound enhancing agent through an IV if needed to better visualize your heart during the echo. This procedure takes approximately one hour.  There are no restrictions for this procedure.  This will take place at [address] [facility] [address]) [address], [place] [address]  Please note: We ask at that you not bring children with you during ultrasound (echo/ vascular) testing. Due to room size and safety concerns, children are not allowed in the ultrasound rooms during exams. Our front office staff cannot provide observation of children in our lobby area while testing is being conducted. An adult accompanying a patient to their appointment will only be allowed in the ultrasound room at the discretion of the ultrasound technician under special circumstances. We apologize for any inconvenience.   Follow-Up: At [facility], you and your health needs are our priority.  As part of our continuing mission to provide you with exceptional heart  care, our providers are all part of one team.  This team includes your primary Cardiologist (physician) and Advanced Practice Providers or APPs (Physician Assistants and Nurse Practitioners) who all work together to provide you with the care you need, when you need it.  Your next appointment:   1 year(s)  Provider:   You may see Redell Cave, MD or one of the following Advanced Practice Providers on your designated Care Team:   Lonni Meager, NP Lesley Maffucci, PA-C Bernardino Bring, PA-C Cadence [place], PA-C Tylene Lunch, NP Barnie Hila, NP    We recommend signing up for the patient portal called MyChart.  Sign up information is provided on this After Visit Summary.  MyChart is used to connect with patients for Virtual Visits (Telemedicine).  Patients are able to view lab/test results, encounter notes, upcoming appointments, etc.  Non-urgent messages can be sent to your provider as well.   To learn more about what you can do with MyChart, go to [email or web address].

## 2024-01-09 NOTE — Progress Notes (Signed)
 Cardiology Office Note:    Date:  01/09/2024   ID:  Beverly Lowe, DOB 1945/12/03, MRN [id number]  PCP:  Beverly Bruckner, MD  [facility] Cardiologist:  Beverly Cave, MD  [facility] Electrophysiologist:  None   Referring MD: Beverly Bruckner, MD   Chief Complaint  Patient presents with   Follow-up    2 year follow up visit and following up post echo. Patient states that she feels good on today. Meds reviewed.     History of Present Illness:    Beverly Lowe is a 78 y.o. female with a hx of anxiety, mild aortic root dilatation, mild to moderate MR who presents for follow-up.    Feels well, denies chest pain or shortness of breath.  Being seen for mitral valve regurgitation and mild aortic root dilatation.  Last echocardiogram in 2023 showed stable mitral valve regurgitation with no significant change from prior.  She denies any new cardiac concerns.  Prior notes Echo 03/2020 EF 55 to 60%, aortic root dilatation 42 mm. Cardiac monitor 03/2020 occasional paroxysmal SVT, no arrhythmias to suggest etiology for syncope.  Past Medical History:  Diagnosis Date   Allergy Gluten   Anemia    Anxiety    did not like the way ssri made her feel ? name   Bowel habit changes 07/11/2021   Cataract    Chronic use of benzodiazepine for therapeutic purpose 07/22/2022   Colon polyps    COVID-19 01/18/2021   GERD (gastroesophageal reflux disease) 2019   Hemorrhoids    Hemorrhoids    Herpes    oral   Insomnia 09/17/2018   Osteoporosis    Shingles    right back/flank remotely lasting 9 months of pain was on gabapentin   Tinnitus    Vitamin D  deficiency 07/22/2022    Past Surgical History:  Procedure Laterality Date   COLONOSCOPY     COSMETIC SURGERY  2000   Rhinoplasty   dental implants     EYE SURGERY  Cataract   LAPAROSCOPIC APPENDECTOMY N/A 11/30/2022   Procedure: APPENDECTOMY LAPAROSCOPIC;  Surgeon: Beverly Laneta FALCON, MD;  Location: [facility] ORS;  Service: General;   Laterality: N/A;   NASAL SEPTUM SURGERY     15 years ago    Current Medications: Current Meds  Medication Sig   Ascorbic Acid (VITAMIN C PO) Take 1 tablet by mouth daily.   CALCIUM PO Take 1 tablet by mouth daily.   Cholecalciferol (VITAMIN D ) 125 MCG (5000 UT) CAPS Take by mouth daily.   Dapsone 7.5 % GEL Apply topically daily.   fluticasone  (FLONASE ) 50 MCG/ACT nasal spray Place 2 sprays into both nostrils daily.   hydrocortisone  (ANUSOL -HC) 2.5 % rectal cream Place rectally 2 (two) times daily as needed for hemorrhoids or anal itching.   MAGNESIUM PO Take 1 tablet by mouth daily.   polyethylene glycol (MIRALAX / GLYCOLAX) 17 g packet Take 17 g by mouth as needed for mild constipation.   tretinoin (RETIN-A) 0.05 % cream Apply 1 Application topically at bedtime.   valACYclovir  (VALTREX ) 1000 MG tablet Take 2 tablets (2,000 mg total) by mouth 2 (two) times daily as needed. X 1 day outbreak prn   zinc gluconate 50 MG tablet Take 50 mg by mouth daily.     Allergies:   Lactose intolerance (gi), Soy allergy (obsolete), and Gluten meal   Social History   Socioeconomic History   Marital status: Married    Spouse name: Not on file   Number of children: 2  Years of education: Not on file   Highest education level: Bachelor's degree (e.g., BA, AB, BS)  Occupational History   Not on file  Tobacco Use   Smoking status: Never    Passive exposure: Never   Smokeless tobacco: Never  Vaping Use   Vaping status: Never Used  Substance and Sexual Activity   Alcohol use: Yes    Comment: socially   Drug use: Never   Sexual activity: Yes  Other Topics Concern   Not on file  Social History Narrative   Moved from [place] in 3 or 05/2018 to be near son and grandkids    Never smoker    Exercise yes      DPR Husband Beverly Lowe [phone]   Social Drivers of Health   Financial Resource Strain: Low Risk  (10/24/2023)   Overall Financial Resource Strain (CARDIA)    Difficulty of  Paying Living Expenses: Not hard at all  Food Insecurity: No Food Insecurity (10/24/2023)   Hunger Vital Sign    Worried About Running Out of Food in the Last Year: Never true    Ran Out of Food in the Last Year: Never true  Transportation Needs: No Transportation Needs (10/24/2023)   PRAPARE - Administrator, Civil Service (Medical): No    Lack of Transportation (Non-Medical): No  Physical Activity: Sufficiently Active (10/24/2023)   Exercise Vital Sign    Days of Exercise per Week: 3 days    Minutes of Exercise per Session: 50 min  Stress: No Stress Concern Present (10/24/2023)   Beverly Lowe of Occupational Health - Occupational Stress Questionnaire    Feeling of Stress: Not at all  Social Connections: Socially Integrated (10/24/2023)   Social Connection and Isolation Panel    Frequency of Communication with Friends and Family: More than three times a week    Frequency of Social Gatherings with Friends and Family: More than three times a week    Attends Religious Services: More than 4 times per year    Active Member of [employer] or Organizations: Yes    Attends Engineer, Structural: More than 4 times per year    Marital Status: Married     Family History: The patient's family history includes Atrial fibrillation in her father and nephew; Breast cancer in her maternal aunt; Cancer in her brother, father, and mother; Colon cancer in her brother and father; Diabetes in her father; Heart disease in her father; Osteoporosis in her maternal grandmother and mother. There is no history of Colon polyps, Esophageal cancer, Stomach cancer, or Rectal cancer.  ROS:   Please see the history of present illness.     All other systems reviewed and are negative.  EKGs/Labs/Other Studies Reviewed:    The following studies were reviewed today:   EKG Interpretation Date/Time:  Thursday January 09 2024 09:12:40 EST Ventricular Rate:  59 PR Interval:  162 QRS Duration:  92 QT  Interval:  428 QTC Calculation: 423 R Axis:   -23  Text Interpretation: Sinus bradycardia Septal infarct , age undetermined Confirmed by Beverly Lowe ([id number]) on 01/09/2024 9:30:59 AM    Recent Labs: No results found for requested labs within last 365 days.  Recent Lipid Panel    Component Value Date/Time   CHOL 211 (H) 11/15/2023 0859   TRIG 80.0 11/15/2023 0859   HDL 74.20 11/15/2023 0859   CHOLHDL 3 11/15/2023 0859   VLDL 16.0 11/15/2023 0859   LDLCALC 121 (H) 11/15/2023 0859  LDLDIRECT 131.0 09/13/2022 0840     Risk Assessment/Calculations:      Physical Exam:    VS:  BP 120/66   Pulse (!) 59   Ht 5' (1.524 m)   Wt 111 lb (50.3 kg)   SpO2 97%   BMI 21.68 kg/m     Wt Readings from Last 3 Encounters:  01/09/24 111 lb (50.3 kg)  11/12/23 109 lb 12.8 Lowe (49.8 kg)  10/28/23 105 lb (47.6 kg)     GEN:  Well nourished, well developed in no acute distress HEENT: Normal NECK: No JVD; No carotid bruits CARDIAC: RRR, no murmurs, rubs, gallops RESPIRATORY:  Clear to auscultation without rales, wheezing or rhonchi  ABDOMEN: Soft, non-tender, non-distended MUSCULOSKELETAL:  No edema; No deformity  SKIN: Warm and dry NEUROLOGIC:  Alert and oriented x 3 PSYCHIATRIC:  Normal affect   ASSESSMENT:    1. Mitral valve insufficiency, unspecified etiology   2. Aortic root dilatation    PLAN:    In order of problems listed above:  Mild to moderate aortic valve regurgitation, on echo 02/2021 EF 55 to 60%, no significant change from prior in 2022.  Repeat echocardiogram. Mild aortic root dilatation measuring 4.2 cm.  Monitor with repeat echo as above.  Follow-up in 1 year, or earlier if echo shows significant abnormality.   Medication Adjustments/Labs and Tests Ordered: Current medicines are reviewed at length with the patient today.  Concerns regarding medicines are outlined above.  Orders Placed This Encounter  Procedures   EKG 12-Lead   ECHOCARDIOGRAM COMPLETE    No orders of the defined types were placed in this encounter.   Patient Instructions  Medication Instructions:  Your physician recommends that you continue on your current medications as directed. Please refer to the Current Medication list given to you today.   *If you need a refill on your cardiac medications before your next appointment, please call your pharmacy*  Lab Work: No labs ordered today  If you have labs (blood work) drawn today and your tests are completely normal, you will receive your results only by: MyChart Message (if you have MyChart) OR A paper copy in the mail If you have any lab test that is abnormal or we need to change your treatment, we will call you to review the results.  Testing/Procedures: Your physician has requested that you have an echocardiogram. Echocardiography is a painless test that uses sound waves to create images of your heart. It provides your doctor with information about the size and shape of your heart and how well your heart's chambers and valves are working.   You may receive an ultrasound enhancing agent through an IV if needed to better visualize your heart during the echo. This procedure takes approximately one hour.  There are no restrictions for this procedure.  This will take place at [address] [facility] [address]) [address], [place] [address]  Please note: We ask at that you not bring children with you during ultrasound (echo/ vascular) testing. Due to room size and safety concerns, children are not allowed in the ultrasound rooms during exams. Our front office staff cannot provide observation of children in our lobby area while testing is being conducted. An adult accompanying a patient to their appointment will only be allowed in the ultrasound room at the discretion of the ultrasound technician under special circumstances. We apologize for any inconvenience.   Follow-Up: At [facility], you and your health needs  are our priority.  As  part of our continuing mission to provide you with exceptional heart care, our providers are all part of one team.  This team includes your primary Cardiologist (physician) and Advanced Practice Providers or APPs (Physician Assistants and Nurse Practitioners) who all work together to provide you with the care you need, when you need it.  Your next appointment:   1 year(s)  Provider:   You may see Beverly Cave, MD or one of the following Advanced Practice Providers on your designated Care Team:   Lonni Meager, NP Lesley Maffucci, PA-C Bernardino Bring, PA-C Cadence [place], PA-C Tylene Lunch, NP Barnie Hila, NP    We recommend signing up for the patient portal called MyChart.  Sign up information is provided on this After Visit Summary.  MyChart is used to connect with patients for Virtual Visits (Telemedicine).  Patients are able to view lab/test results, encounter notes, upcoming appointments, etc.  Non-urgent messages can be sent to your provider as well.   To learn more about what you can do with MyChart, go to [email or web address].            Signed, Beverly Cave, MD  01/09/2024 12:32 PM    [facility]

## 2024-02-03 ENCOUNTER — Other Ambulatory Visit: Payer: Self-pay

## 2024-02-03 DIAGNOSIS — Z1231 Encounter for screening mammogram for malignant neoplasm of breast: Secondary | ICD-10-CM

## 2024-02-05 ENCOUNTER — Encounter

## 2024-02-12 ENCOUNTER — Ambulatory Visit: Attending: Cardiology

## 2024-02-12 DIAGNOSIS — I340 Nonrheumatic mitral (valve) insufficiency: Secondary | ICD-10-CM | POA: Diagnosis not present

## 2024-02-12 LAB — ECHOCARDIOGRAM COMPLETE
AR max vel: 2.42 cm2
AV Area VTI: 2.27 cm2
AV Area mean vel: 2.41 cm2
AV Mean grad: 5 mmHg
AV Peak grad: 10.2 mmHg
Ao pk vel: 1.6 m/s
Area-P 1/2: 2.37 cm2
P 1/2 time: 495 ms
S' Lateral: 3 cm

## 2024-02-17 ENCOUNTER — Ambulatory Visit: Payer: Self-pay | Admitting: Cardiology

## 2024-02-24 ENCOUNTER — Ambulatory Visit: Admission: RE | Admit: 2024-02-24 | Discharge: 2024-02-24 | Disposition: A | Source: Ambulatory Visit

## 2024-02-24 DIAGNOSIS — Z1231 Encounter for screening mammogram for malignant neoplasm of breast: Secondary | ICD-10-CM | POA: Insufficient documentation

## 2024-02-27 ENCOUNTER — Ambulatory Visit: Payer: Self-pay

## 2024-03-03 ENCOUNTER — Ambulatory Visit (INDEPENDENT_AMBULATORY_CARE_PROVIDER_SITE_OTHER)

## 2024-03-03 VITALS — BP 116/66 | HR 62 | Temp 98.1°F | Ht 60.0 in | Wt 112.8 lb

## 2024-03-03 DIAGNOSIS — I77819 Aortic ectasia, unspecified site: Secondary | ICD-10-CM | POA: Diagnosis not present

## 2024-03-03 DIAGNOSIS — H9313 Tinnitus, bilateral: Secondary | ICD-10-CM | POA: Diagnosis not present

## 2024-03-03 DIAGNOSIS — H04121 Dry eye syndrome of right lacrimal gland: Secondary | ICD-10-CM | POA: Insufficient documentation

## 2024-03-03 DIAGNOSIS — B001 Herpesviral vesicular dermatitis: Secondary | ICD-10-CM

## 2024-03-03 DIAGNOSIS — E782 Mixed hyperlipidemia: Secondary | ICD-10-CM

## 2024-03-03 DIAGNOSIS — B009 Herpesviral infection, unspecified: Secondary | ICD-10-CM | POA: Insufficient documentation

## 2024-03-03 DIAGNOSIS — E538 Deficiency of other specified B group vitamins: Secondary | ICD-10-CM | POA: Diagnosis not present

## 2024-03-03 MED ORDER — VALACYCLOVIR HCL 1 G PO TABS: 2000.0000 mg | ORAL_TABLET | Freq: Two times a day (BID) | ORAL | 11 refills | Status: AC | PRN

## 2024-03-03 NOTE — Assessment & Plan Note (Signed)
 Recommend trial of preservative-free lubricating eyedrops. Advised daily use of eye drops to manage symptoms. She can get these over the counter. Recommend follow up with ophthalmology as well.

## 2024-03-03 NOTE — Assessment & Plan Note (Addendum)
 Intermittent flareup managed with Valtrex  for outbreak treatment.  Valtrex  refill sent as outlined. Orders:   valACYclovir  (VALTREX ) 1000 MG tablet; Take 2 tablets (2,000 mg total) by mouth 2 (two) times daily as needed. X 1 day outbreak prn

## 2024-03-03 NOTE — Progress Notes (Signed)
 "  Established Patient Office Visit   Subjective  Patient ID: Beverly Lowe, female    DOB: 01/26/1946  Age: 79 y.o. MRN: [id number]  Chief Complaint  Patient presents with   Hyperlipidemia    Discussed the use of AI scribe software for clinical note transcription with the patient, who gave verbal consent to proceed.  History of Present Illness  Beverly Lowe is a 79 year old female who presents for follow-up regarding tinnitus and medication management.  She experiences intermittent tinnitus bilaterally, left worse than right, disrupting her sleep when she experiences it.  The tinnitus varies in intensity, with quieter days and very loud nights, impacting her ability to sleep. She previously tried hydroxyzine  to manage this condition but discontinued it after two doses due to side effects, including a sensation of tongue swelling. She has not resumed any other medication like Xanax  for sleep, as she manages without it currently.  She has previously seen ENT and was recommended potentially hearing evaluation.  She was dissatisfied with recommendations and has not followed up with ENT for few years now.   She continues to use Valtrex  as needed for fever blisters, although she has not had a breakout in a long time.   She takes following over-the-counter supplements:  Zinc 50 mg Magnesium 250 mg Vitamin C 1000 mg Calcium with vitamin D  600 mg +800 international units Vitamin D  1000 international units    She also uses Miralax as needed for constipation and hydrocortisone  cream for hemorrhoids.  Since her last visit she has seen cardiologist for asymptomatic mitral valve regurgitation, mild aortic root dilatation.  Had an echocardiogram recently which was reassuring.    She has not been on statins despite elevated LDL cholesterol, as she prefers not to take them.    She experiences tearing in her eyes, which she attributes to dry eyes, right worse than left.  She has not  received the second dose of the Shingrix  vaccine due to feeling unwell after the first dose and has declined the pneumonia vaccine. She is cautious about taking medications due to potential side effects and prefers to manage her health with minimal medication use.      ROS As per HPI    Objective:     BP 116/66 (BP Location: Left Arm, Patient Position: Sitting)   Pulse 62   Temp 98.1 F (36.7 C) (Oral)   Ht 5' (1.524 m)   Wt 112 lb 12.8 oz (51.2 kg)   SpO2 92%   BMI 22.03 kg/m      03/03/2024    2:00 PM 11/12/2023    2:12 PM 10/28/2023    1:51 PM  Depression screen PHQ 2/9  Decreased Interest 0 0 0  Down, Depressed, Hopeless 0 0 0  PHQ - 2 Score 0 0 0  Altered sleeping 0 0 0  Tired, decreased energy 0 0 0  Change in appetite 0 0 0  Feeling bad or failure about yourself  0 0 0  Trouble concentrating 0 0 0  Moving slowly or fidgety/restless 0 0 0  Suicidal thoughts 0 0 0  PHQ-9 Score 0 0  0   Difficult doing work/chores Not difficult at all Not difficult at all Not difficult at all     Data saved with a previous flowsheet row definition      03/03/2024    2:00 PM 11/12/2023    2:12 PM 09/20/2022    9:31 AM 05/05/2019    2:49 PM  GAD 7 : Generalized Anxiety Score  Nervous, Anxious, on Edge 0 0  0  0   Control/stop worrying 0 0  0  0   Worry too much - different things 0 0  0  0   Trouble relaxing 0 0  0  0   Restless 0 0  0  0   Easily annoyed or irritable 0 0  0  0   Afraid - awful might happen 0 0  0  0   Total GAD 7 Score 0 0 0 0  Anxiety Difficulty Not difficult at all Not difficult at all Not difficult at all Not difficult at all     Data saved with a previous flowsheet row definition      03/03/2024    2:00 PM 11/12/2023    2:12 PM 10/28/2023    1:51 PM  Depression screen PHQ 2/9  Decreased Interest 0 0 0  Down, Depressed, Hopeless 0 0 0  PHQ - 2 Score 0 0 0  Altered sleeping 0 0 0  Tired, decreased energy 0 0 0  Change in appetite 0 0 0  Feeling bad  or failure about yourself  0 0 0  Trouble concentrating 0 0 0  Moving slowly or fidgety/restless 0 0 0  Suicidal thoughts 0 0 0  PHQ-9 Score 0 0  0   Difficult doing work/chores Not difficult at all Not difficult at all Not difficult at all     Data saved with a previous flowsheet row definition      03/03/2024    2:00 PM 11/12/2023    2:12 PM 09/20/2022    9:31 AM 05/05/2019    2:49 PM  GAD 7 : Generalized Anxiety Score  Nervous, Anxious, on Edge 0 0  0  0   Control/stop worrying 0 0  0  0   Worry too much - different things 0 0  0  0   Trouble relaxing 0 0  0  0   Restless 0 0  0  0   Easily annoyed or irritable 0 0  0  0   Afraid - awful might happen 0 0  0  0   Total GAD 7 Score 0 0 0 0  Anxiety Difficulty Not difficult at all Not difficult at all Not difficult at all Not difficult at all     Data saved with a previous flowsheet row definition   SDOH Screenings   Food Insecurity: No Food Insecurity (02/28/2024)  Housing: Unknown (02/28/2024)  Transportation Needs: No Transportation Needs (02/28/2024)  Utilities: Not At Risk (10/28/2023)  Alcohol Screen: Low Risk (10/24/2023)  Depression (PHQ2-9): Low Risk (03/03/2024)  Financial Resource Strain: Low Risk (02/28/2024)  Physical Activity: Sufficiently Active (02/28/2024)  Social Connections: Socially Integrated (02/28/2024)  Stress: No Stress Concern Present (02/28/2024)  Tobacco Use: Low Risk (03/03/2024)  Health Literacy: Adequate Health Literacy (10/28/2023)     Physical Exam Constitutional:      Appearance: Normal appearance.  HENT:     Head: Normocephalic and atraumatic.     Right Ear: There is no impacted cerumen.     Left Ear: There is no impacted cerumen.     Mouth/Throat:     Mouth: Mucous membranes are moist.  Eyes:     General: No scleral icterus.    Comments: OD: clear tearing   Neck:     Thyroid : No thyroid  mass or thyroid  tenderness.  Cardiovascular:     Rate and Rhythm:  Normal rate and regular rhythm.   Pulmonary:     Effort: Pulmonary effort is normal.     Breath sounds: Normal breath sounds.  Abdominal:     General: Bowel sounds are normal.     Palpations: Abdomen is soft.  Musculoskeletal:     Cervical back: Neck supple. No rigidity.     Right lower leg: No edema.     Left lower leg: No edema.  Skin:    General: Skin is warm.  Neurological:     Mental Status: She is alert and oriented to person, place, and time.  Psychiatric:        Mood and Affect: Mood normal.        Behavior: Behavior normal.        No results found for any visits on 03/03/24.  The 10-year ASCVD risk score (Arnett DK, et al., 2019) is: 21.3%     Assessment & Plan:  Patient is a pleasant 79 year old female presenting for chronic disease management.  She qualifies for pneumonia, second dose of shingles, tetanus booster, COVID-19 immunization.  She declined all vaccination today.  Assessment & Plan Tinnitus, bilateral Chronic, stable now.  Intermittently affecting her sleep and needing previous treatment with Xanax , hydroxyzine .  Xanax  helped.  Hydroxyzine  was not helpful.  Discontinue hydroxyzine .  Has seen ENT Dr. Herminio in the past. Given ongoing intermittent symptoms recommend reevaluation by ENT.  Patient prefers second opinion with a different ENT specialist.  Referral made today to ENT [facility].    Proper use of Flonase  recommended, use nasal Flonase  in each nostril daily as well.  Orders:   Ambulatory referral to ENT  Herpes labialis Intermittent flareup managed with Valtrex  for outbreak treatment.  Valtrex  refill sent as outlined. Orders:   valACYclovir  (VALTREX ) 1000 MG tablet; Take 2 tablets (2,000 mg total) by mouth 2 (two) times daily as needed. X 1 day outbreak prn  Low serum vitamin B12 Mildly low vitamin B-12 on 04/02/2022 at 332. Recommend repeat vitamin B12 level.  Management pending result. Orders:   B12; Future  Chronic dryness of right eye Recommend trial of  preservative-free lubricating eyedrops. Advised daily use of eye drops to manage symptoms. She can get these over the counter. Recommend follow up with ophthalmology as well.      Mixed hyperlipidemia  She declines statin therapy. Prefers lifestyle management over medication. Has elevated LDL with normal HDL in the past. Understands risk of elevated ASCVD without statin. Discussed dietary modifications, including Mediterranean diet. Recommend checking fasting lipid with next lab. She already has lab ordered from her last visit.     Aortic dilatation Patient asymptomatic.  Established with cardiology, last visit on 01/09/24, underwent echocardiogram which was stable. Annual f/u recommended.  .  Recent echo showed        Return in about 6 months (around 08/31/2024) for Follow up with Dr. Abbey in 6 months, fasting labs at patient's convinience:b12 lipid,A1c,Cmp, vit d.   Luke Abbey, MD "

## 2024-03-03 NOTE — Assessment & Plan Note (Signed)
 Patient asymptomatic.  Established with cardiology, last visit on 01/09/24, underwent echocardiogram which was stable. Annual f/u recommended.  .  Recent echo showed

## 2024-03-03 NOTE — Assessment & Plan Note (Signed)
"   She declines statin therapy. Prefers lifestyle management over medication. Has elevated LDL with normal HDL in the past. Understands risk of elevated ASCVD without statin. Discussed dietary modifications, including Mediterranean diet. Recommend checking fasting lipid with next lab. She already has lab ordered from her last visit.     "

## 2024-03-03 NOTE — Assessment & Plan Note (Addendum)
 Mildly low vitamin B-12 on 04/02/2022 at 332. Recommend repeat vitamin B12 level.  Management pending result. Orders:   B12; Future

## 2024-03-03 NOTE — Patient Instructions (Signed)
 You can try refresh individual eye drop/preservative free to help with dry eyes.

## 2024-03-03 NOTE — Assessment & Plan Note (Addendum)
 Chronic, stable now.  Intermittently affecting her sleep and needing previous treatment with Xanax , hydroxyzine .  Xanax  helped.  Hydroxyzine  was not helpful.  Discontinue hydroxyzine .  Has seen ENT Dr. Herminio in the past. Given ongoing intermittent symptoms recommend reevaluation by ENT.  Patient prefers second opinion with a different ENT specialist.  Referral made today to ENT [facility].    Proper use of Flonase  recommended, use nasal Flonase  in each nostril daily as well.  Orders:   Ambulatory referral to ENT

## 2024-03-26 ENCOUNTER — Institutional Professional Consult (permissible substitution) (INDEPENDENT_AMBULATORY_CARE_PROVIDER_SITE_OTHER): Admitting: Physician Assistant

## 2024-08-27 ENCOUNTER — Other Ambulatory Visit

## 2024-08-31 ENCOUNTER — Ambulatory Visit

## 2024-11-02 ENCOUNTER — Ambulatory Visit
